# Patient Record
Sex: Female | Born: 1935
Health system: Southern US, Community
[De-identification: ages and names within clinical notes are randomized; demographics above are authoritative.]

## PROBLEM LIST (undated history)

## (undated) DIAGNOSIS — M810 Age-related osteoporosis without current pathological fracture: Secondary | ICD-10-CM

## (undated) HISTORY — PX: APPENDECTOMY: SHX54

---

## 1998-11-27 ENCOUNTER — Other Ambulatory Visit: Admission: RE | Admit: 1998-11-27 | Discharge: 1998-11-27 | Payer: Self-pay | Admitting: Gynecology

## 2000-01-01 ENCOUNTER — Encounter: Payer: Self-pay | Admitting: Gynecology

## 2000-01-01 ENCOUNTER — Encounter: Admission: RE | Admit: 2000-01-01 | Discharge: 2000-01-01 | Payer: Self-pay | Admitting: Gynecology

## 2000-01-25 ENCOUNTER — Encounter: Payer: Self-pay | Admitting: Gynecology

## 2000-01-25 ENCOUNTER — Encounter: Admission: RE | Admit: 2000-01-25 | Discharge: 2000-01-25 | Payer: Self-pay | Admitting: Gynecology

## 2000-10-25 HISTORY — PX: APPENDECTOMY: SHX54

## 2001-01-27 ENCOUNTER — Encounter: Admission: RE | Admit: 2001-01-27 | Discharge: 2001-01-27 | Payer: Self-pay | Admitting: *Deleted

## 2001-01-27 ENCOUNTER — Encounter: Payer: Self-pay | Admitting: *Deleted

## 2001-04-06 ENCOUNTER — Encounter (INDEPENDENT_AMBULATORY_CARE_PROVIDER_SITE_OTHER): Payer: Self-pay | Admitting: Specialist

## 2001-04-06 ENCOUNTER — Encounter: Payer: Self-pay | Admitting: Emergency Medicine

## 2001-04-07 ENCOUNTER — Inpatient Hospital Stay (HOSPITAL_COMMUNITY): Admission: EM | Admit: 2001-04-07 | Discharge: 2001-04-09 | Payer: Self-pay | Admitting: Emergency Medicine

## 2001-04-07 ENCOUNTER — Encounter: Payer: Self-pay | Admitting: Emergency Medicine

## 2001-05-09 ENCOUNTER — Other Ambulatory Visit: Admission: RE | Admit: 2001-05-09 | Discharge: 2001-05-09 | Payer: Self-pay | Admitting: *Deleted

## 2002-01-30 ENCOUNTER — Encounter: Admission: RE | Admit: 2002-01-30 | Discharge: 2002-01-30 | Payer: Self-pay | Admitting: *Deleted

## 2002-01-30 ENCOUNTER — Encounter: Payer: Self-pay | Admitting: *Deleted

## 2002-02-02 ENCOUNTER — Other Ambulatory Visit: Admission: RE | Admit: 2002-02-02 | Discharge: 2002-02-02 | Payer: Self-pay | Admitting: *Deleted

## 2003-02-05 ENCOUNTER — Encounter: Payer: Self-pay | Admitting: *Deleted

## 2003-02-05 ENCOUNTER — Encounter: Admission: RE | Admit: 2003-02-05 | Discharge: 2003-02-05 | Payer: Self-pay | Admitting: *Deleted

## 2003-02-07 ENCOUNTER — Other Ambulatory Visit: Admission: RE | Admit: 2003-02-07 | Discharge: 2003-02-07 | Payer: Self-pay | Admitting: *Deleted

## 2004-09-04 ENCOUNTER — Encounter: Admission: RE | Admit: 2004-09-04 | Discharge: 2004-09-04 | Payer: Self-pay | Admitting: *Deleted

## 2005-09-07 ENCOUNTER — Encounter: Admission: RE | Admit: 2005-09-07 | Discharge: 2005-09-07 | Payer: Self-pay | Admitting: *Deleted

## 2006-09-07 ENCOUNTER — Other Ambulatory Visit: Admission: RE | Admit: 2006-09-07 | Discharge: 2006-09-07 | Payer: Self-pay | Admitting: Obstetrics and Gynecology

## 2006-09-08 ENCOUNTER — Encounter: Admission: RE | Admit: 2006-09-08 | Discharge: 2006-09-08 | Payer: Self-pay | Admitting: *Deleted

## 2007-09-20 ENCOUNTER — Encounter: Admission: RE | Admit: 2007-09-20 | Discharge: 2007-09-20 | Payer: Self-pay | Admitting: Obstetrics and Gynecology

## 2008-09-13 ENCOUNTER — Other Ambulatory Visit: Admission: RE | Admit: 2008-09-13 | Discharge: 2008-09-13 | Payer: Self-pay | Admitting: Obstetrics and Gynecology

## 2008-10-10 ENCOUNTER — Encounter: Admission: RE | Admit: 2008-10-10 | Discharge: 2008-10-10 | Payer: Self-pay | Admitting: Obstetrics and Gynecology

## 2008-10-16 ENCOUNTER — Encounter: Admission: RE | Admit: 2008-10-16 | Discharge: 2008-10-16 | Payer: Self-pay | Admitting: Obstetrics and Gynecology

## 2009-04-08 ENCOUNTER — Encounter: Admission: RE | Admit: 2009-04-08 | Discharge: 2009-04-08 | Payer: Self-pay | Admitting: Obstetrics and Gynecology

## 2010-01-21 ENCOUNTER — Encounter: Admission: RE | Admit: 2010-01-21 | Discharge: 2010-01-21 | Payer: Self-pay | Admitting: Obstetrics and Gynecology

## 2011-03-12 NOTE — H&P (Signed)
[facility]  Patient:    Cynthia Benitez, Cynthia Benitez Visit Number: [id number] MRN: [id number]          Service Type: EMS Location: ED Attending Physician:  Devoria Albe Admit Date:  04/06/2001                           History and Physical  ACCOUNT:  [id number]  CHIEF COMPLAINT:  Abdominal pain.  HISTORY OF PRESENT ILLNESS:  This patient is a 76 year old lady who presented to the emergency room with, by the time I saw her, a 22-hour history of abdominal pain which began poorly localized, but was localized by the time she came to the emergency room directly in her right lower quadrant with associated nausea, cramps, and after her CT scan some diarrhea, but she also had a little of this pre-CT scan.  She felt a little bit better by the time I saw her than she had earlier in the evening.  Because of these symptoms she presented to the emergency room.  She had never had problems like this in the past and has never had any other significant GI problems.  ALLERGIES:  CODEINE and SULFA.  MEDICATIONS:  Actonel for osteoporosis.  HABITS:  Smokes none and alcohol none.  FAMILY HISTORY:  Unremarkable.  PRIOR OPERATIONS:  None.  REVIEW OF SYSTEMS:  HEENT:  Negative.  CHEST:  No cough or shortness of breath.  HEART:  Questionable history of a murmur.  No history of hypertension.  ABDOMEN:  Negative, except for HPI.  GU:  Negative. EXTREMITIES:  Negative.  PHYSICAL EXAMINATION:  GENERAL:  The patient is a healthy, but uncomfortable appearing 77 year old. She is alert and awake in no acute distress.  VITAL SIGNS:  Temperature 100.6.  HEENT:  Head is normocephalic.  Eyes nonicteric.  Pupils equal, round, and regular.  NECK:  Supple, no masses or thyromegaly.  LUNGS:  Clear to auscultation.  HEART:  Regular without murmur, rub or gallop that I can hear, although the EDP did hear a 2/6 systolic murmur.  ABDOMEN:  Generally soft, except for marked right lower  quadrant tenderness and guarding with referred rebound into the right lower quadrant and no definite mass.  No hepatosplenomegaly is noted.  RECTAL:  Not done.  EXTREMITIES:  No cyanosis or edema.  LABORATORY:  White count 16,900, hemoglobin 14.  Chemistries basically normal.  X-RAYS:  CT consistent with acute appendicitis with a retrocecal appendix. Chest x-ray looks normal to me, although official reading pending.  EKG showed mild LAE.  IMPRESSION:  Acute appendicitis.  PLAN:  For appendectomy.  She is thin and with a retrocecal appendix I think this would be best done as an open appendectomy. I discussed indications, risks, and complications with the patient and her daughter.  I think all questions have been answered and we will proceed to surgery this morning. Attending Physician:  Devoria Albe DD:  04/07/01 TD:  04/07/01 Job: [id number]

## 2011-03-12 NOTE — Discharge Summary (Signed)
[facility]  Patient:    TENECIA, IGNASIAK                       MRN: [id number] Adm. Date:  [id number] Disc. Date: [id number] Attending:  Charlton Haws                           Discharge Summary  DISCHARGE DIAGNOSIS:  Acute suppurative appendicitis.  HISTORY OF PRESENT ILLNESS:  This patient is a 77 year old lady with a fairly classic history of acute appendicitis with CT scan confirming that.  HOSPITAL COURSE:  The patient was admitted and taken to the operating room at about 2:50 a.m. where an appendectomy was performed.  She tolerated the procedure well and was begun on liquids and advanced over the next 24 hours. She was a little bit bloated on June 16, but later in the day had a good result from a Fleet enema.  Abdomen was softer and she was seen by Dr. Magnus Ivan and felt okay to be discharged.  DISCHARGE MEDICATION:  Tylox for pain.  SPECIAL INSTRUCTIONS:  She is able to shower.  FOLLOWUP:  Follow up in my office in approximately one week. DD:  04/12/01 TD:  04/13/01 Job: [id number]

## 2011-03-12 NOTE — Op Note (Signed)
[place] [facility]  Patient:    KILANI, JOFFE                       MRN: [id number] Proc. Date: 04/07/01 Adm. Date:  [id number] Attending:  Devoria Albe CC:         Al Decant. Janey Greaser, M.D.   Operative Report  PREOPERATIVE DIAGNOSIS:  Acute appendicitis.  POSTOPERATIVE DIAGNOSIS:  Acute appendicitis--gangrenous.  OPERATION PERFORMED:  Appendectomy.  SURGEON:  Currie Paris, M.D.  ANESTHESIA:  General.  INDICATIONS FOR PROCEDURE:  The patient is a 77 year old with a fairly classic history for acute appendicitis, a fairly classic physical examination and a CT scan demonstrating appendicitis.  DESCRIPTION OF PROCEDURE:  The patient was brought to the operating room and after satisfactory general endotracheal anesthesia had been obtained, the abdomen was prepped and draped.  I made a transverse right lower quadrant incision deepened it to the external oblique which was opened transversely. Deeper muscle layers were split in line with the fibers and a retractor was used to identify the peritoneum which was picked up and opened.  With gentle manipulation, I was able to palpate the appendix lying retrocecally and with a little bit more manipulation, able to manipulate it up into the wound.  It popped up nicely and had not perforated although was gangrenous almost its entire length.  The mesoappendix was clamped and divided, ligating it with 2-0 chromic.  The base of the appendix was crushed with a clamp, ligated with a 0 chromic and amputated.  2-0 silk was used to place a pursestring and the appendiceal base was inverted and the pursestring suture tied down producing a nice inversion.  The cecum was returned to the peritoneal cavity.  There was no evidence of any bleeding.  There was no significant fluid to suction out.  The peritoneum was closed with a running 0 chromic, the fascial layers with 0 PDS and the skin with a 4-0 subcuticular plus  Steri-Strips.  The patient tolerated the procedure well.  There were no operative complications.  All counts were correct. DD:  04/07/01 TD:  04/07/01 Job: [id number]

## 2011-11-08 DIAGNOSIS — I8310 Varicose veins of unspecified lower extremity with inflammation: Secondary | ICD-10-CM | POA: Diagnosis not present

## 2011-11-12 DIAGNOSIS — M79609 Pain in unspecified limb: Secondary | ICD-10-CM | POA: Diagnosis not present

## 2011-11-12 DIAGNOSIS — I8310 Varicose veins of unspecified lower extremity with inflammation: Secondary | ICD-10-CM | POA: Diagnosis not present

## 2011-12-24 DIAGNOSIS — M79609 Pain in unspecified limb: Secondary | ICD-10-CM | POA: Diagnosis not present

## 2011-12-24 DIAGNOSIS — I8310 Varicose veins of unspecified lower extremity with inflammation: Secondary | ICD-10-CM | POA: Diagnosis not present

## 2012-01-19 DIAGNOSIS — I8310 Varicose veins of unspecified lower extremity with inflammation: Secondary | ICD-10-CM | POA: Diagnosis not present

## 2012-01-19 DIAGNOSIS — M79609 Pain in unspecified limb: Secondary | ICD-10-CM | POA: Diagnosis not present

## 2012-04-03 DIAGNOSIS — L821 Other seborrheic keratosis: Secondary | ICD-10-CM | POA: Diagnosis not present

## 2012-04-03 DIAGNOSIS — D485 Neoplasm of uncertain behavior of skin: Secondary | ICD-10-CM | POA: Diagnosis not present

## 2012-04-03 DIAGNOSIS — D239 Other benign neoplasm of skin, unspecified: Secondary | ICD-10-CM | POA: Diagnosis not present

## 2012-04-03 DIAGNOSIS — Z85828 Personal history of other malignant neoplasm of skin: Secondary | ICD-10-CM | POA: Diagnosis not present

## 2012-04-03 DIAGNOSIS — L820 Inflamed seborrheic keratosis: Secondary | ICD-10-CM | POA: Diagnosis not present

## 2012-06-10 DIAGNOSIS — L42 Pityriasis rosea: Secondary | ICD-10-CM | POA: Diagnosis not present

## 2012-07-26 DIAGNOSIS — I8310 Varicose veins of unspecified lower extremity with inflammation: Secondary | ICD-10-CM | POA: Diagnosis not present

## 2012-08-03 DIAGNOSIS — I8310 Varicose veins of unspecified lower extremity with inflammation: Secondary | ICD-10-CM | POA: Diagnosis not present

## 2012-08-25 DIAGNOSIS — Z961 Presence of intraocular lens: Secondary | ICD-10-CM | POA: Diagnosis not present

## 2012-08-30 DIAGNOSIS — Z23 Encounter for immunization: Secondary | ICD-10-CM | POA: Diagnosis not present

## 2013-04-10 DIAGNOSIS — Z85828 Personal history of other malignant neoplasm of skin: Secondary | ICD-10-CM | POA: Diagnosis not present

## 2013-04-10 DIAGNOSIS — D239 Other benign neoplasm of skin, unspecified: Secondary | ICD-10-CM | POA: Diagnosis not present

## 2013-04-10 DIAGNOSIS — D485 Neoplasm of uncertain behavior of skin: Secondary | ICD-10-CM | POA: Diagnosis not present

## 2013-04-10 DIAGNOSIS — C44519 Basal cell carcinoma of skin of other part of trunk: Secondary | ICD-10-CM | POA: Diagnosis not present

## 2013-04-10 DIAGNOSIS — L821 Other seborrheic keratosis: Secondary | ICD-10-CM | POA: Diagnosis not present

## 2013-05-15 DIAGNOSIS — C44519 Basal cell carcinoma of skin of other part of trunk: Secondary | ICD-10-CM | POA: Diagnosis not present

## 2013-07-26 DIAGNOSIS — Z23 Encounter for immunization: Secondary | ICD-10-CM | POA: Diagnosis not present

## 2013-09-05 DIAGNOSIS — L570 Actinic keratosis: Secondary | ICD-10-CM | POA: Diagnosis not present

## 2013-09-05 DIAGNOSIS — Z85828 Personal history of other malignant neoplasm of skin: Secondary | ICD-10-CM | POA: Diagnosis not present

## 2013-09-05 DIAGNOSIS — L821 Other seborrheic keratosis: Secondary | ICD-10-CM | POA: Diagnosis not present

## 2013-09-05 DIAGNOSIS — D239 Other benign neoplasm of skin, unspecified: Secondary | ICD-10-CM | POA: Diagnosis not present

## 2013-09-13 DIAGNOSIS — Z961 Presence of intraocular lens: Secondary | ICD-10-CM | POA: Diagnosis not present

## 2014-05-07 DIAGNOSIS — L821 Other seborrheic keratosis: Secondary | ICD-10-CM | POA: Diagnosis not present

## 2014-05-07 DIAGNOSIS — C44711 Basal cell carcinoma of skin of unspecified lower limb, including hip: Secondary | ICD-10-CM | POA: Diagnosis not present

## 2014-05-07 DIAGNOSIS — Z85828 Personal history of other malignant neoplasm of skin: Secondary | ICD-10-CM | POA: Diagnosis not present

## 2014-05-07 DIAGNOSIS — D239 Other benign neoplasm of skin, unspecified: Secondary | ICD-10-CM | POA: Diagnosis not present

## 2014-05-07 DIAGNOSIS — D485 Neoplasm of uncertain behavior of skin: Secondary | ICD-10-CM | POA: Diagnosis not present

## 2014-06-17 DIAGNOSIS — C44711 Basal cell carcinoma of skin of unspecified lower limb, including hip: Secondary | ICD-10-CM | POA: Diagnosis not present

## 2014-08-07 DIAGNOSIS — Z23 Encounter for immunization: Secondary | ICD-10-CM | POA: Diagnosis not present

## 2014-09-26 DIAGNOSIS — Z961 Presence of intraocular lens: Secondary | ICD-10-CM | POA: Diagnosis not present

## 2015-06-05 DIAGNOSIS — Z86018 Personal history of other benign neoplasm: Secondary | ICD-10-CM | POA: Diagnosis not present

## 2015-06-05 DIAGNOSIS — L821 Other seborrheic keratosis: Secondary | ICD-10-CM | POA: Diagnosis not present

## 2015-06-05 DIAGNOSIS — Z85828 Personal history of other malignant neoplasm of skin: Secondary | ICD-10-CM | POA: Diagnosis not present

## 2015-06-05 DIAGNOSIS — D225 Melanocytic nevi of trunk: Secondary | ICD-10-CM | POA: Diagnosis not present

## 2015-06-26 DIAGNOSIS — E780 Pure hypercholesterolemia: Secondary | ICD-10-CM | POA: Diagnosis not present

## 2015-06-26 DIAGNOSIS — M810 Age-related osteoporosis without current pathological fracture: Secondary | ICD-10-CM | POA: Diagnosis not present

## 2015-06-26 DIAGNOSIS — Z23 Encounter for immunization: Secondary | ICD-10-CM | POA: Diagnosis not present

## 2015-06-26 DIAGNOSIS — D696 Thrombocytopenia, unspecified: Secondary | ICD-10-CM | POA: Diagnosis not present

## 2015-06-26 DIAGNOSIS — Z1389 Encounter for screening for other disorder: Secondary | ICD-10-CM | POA: Diagnosis not present

## 2015-06-26 DIAGNOSIS — Z131 Encounter for screening for diabetes mellitus: Secondary | ICD-10-CM | POA: Diagnosis not present

## 2015-07-31 DIAGNOSIS — D696 Thrombocytopenia, unspecified: Secondary | ICD-10-CM | POA: Diagnosis not present

## 2015-11-10 DIAGNOSIS — Z961 Presence of intraocular lens: Secondary | ICD-10-CM | POA: Diagnosis not present

## 2016-06-23 DIAGNOSIS — L821 Other seborrheic keratosis: Secondary | ICD-10-CM | POA: Diagnosis not present

## 2016-06-23 DIAGNOSIS — Z86018 Personal history of other benign neoplasm: Secondary | ICD-10-CM | POA: Diagnosis not present

## 2016-06-23 DIAGNOSIS — Z85828 Personal history of other malignant neoplasm of skin: Secondary | ICD-10-CM | POA: Diagnosis not present

## 2016-06-23 DIAGNOSIS — D225 Melanocytic nevi of trunk: Secondary | ICD-10-CM | POA: Diagnosis not present

## 2016-06-23 DIAGNOSIS — S80862A Insect bite (nonvenomous), left lower leg, initial encounter: Secondary | ICD-10-CM | POA: Diagnosis not present

## 2016-08-03 DIAGNOSIS — Z23 Encounter for immunization: Secondary | ICD-10-CM | POA: Diagnosis not present

## 2017-01-17 DIAGNOSIS — H524 Presbyopia: Secondary | ICD-10-CM | POA: Diagnosis not present

## 2017-01-17 DIAGNOSIS — Z961 Presence of intraocular lens: Secondary | ICD-10-CM | POA: Diagnosis not present

## 2017-05-12 ENCOUNTER — Encounter (HOSPITAL_BASED_OUTPATIENT_CLINIC_OR_DEPARTMENT_OTHER): Payer: Self-pay

## 2017-05-12 ENCOUNTER — Emergency Department (HOSPITAL_BASED_OUTPATIENT_CLINIC_OR_DEPARTMENT_OTHER): Payer: Medicare Other

## 2017-05-12 ENCOUNTER — Emergency Department (HOSPITAL_BASED_OUTPATIENT_CLINIC_OR_DEPARTMENT_OTHER)
Admission: EM | Admit: 2017-05-12 | Discharge: 2017-05-12 | Disposition: A | Discharge status: Home / Self Care | Payer: Medicare Other | Attending: Emergency Medicine | Admitting: Emergency Medicine

## 2017-05-12 DIAGNOSIS — Y939 Activity, unspecified: Secondary | ICD-10-CM | POA: Diagnosis not present

## 2017-05-12 DIAGNOSIS — S9031XA Contusion of right foot, initial encounter: Secondary | ICD-10-CM | POA: Insufficient documentation

## 2017-05-12 DIAGNOSIS — Y929 Unspecified place or not applicable: Secondary | ICD-10-CM | POA: Diagnosis not present

## 2017-05-12 DIAGNOSIS — Y999 Unspecified external cause status: Secondary | ICD-10-CM | POA: Diagnosis not present

## 2017-05-12 DIAGNOSIS — X58XXXA Exposure to other specified factors, initial encounter: Secondary | ICD-10-CM | POA: Diagnosis not present

## 2017-05-12 DIAGNOSIS — M7989 Other specified soft tissue disorders: Secondary | ICD-10-CM | POA: Diagnosis not present

## 2017-05-12 DIAGNOSIS — I739 Peripheral vascular disease, unspecified: Secondary | ICD-10-CM | POA: Diagnosis present

## 2017-05-12 MED ORDER — IBUPROFEN 400 MG PO TABS
400.0000 mg | ORAL_TABLET | Freq: Once | ORAL | Status: AC
  Administered 2017-05-12: 400 mg via ORAL
  Filled 2017-05-12: qty 1

## 2017-05-12 NOTE — ED Triage Notes (Addendum)
Pt states sent by Urgent Care for RLE u/s to r/o DVT - RLE pain, swelling, redness after crossing legs for one hour today. Denies shortness of breath. No recent travel. No history of same. Denies injury or insect bites.

## 2017-05-12 NOTE — ED Notes (Signed)
Pt verbalizes understanding of d/c instructions and denies any further needs at this time. 

## 2017-05-12 NOTE — ED Provider Notes (Signed)
[facility] Provider Note   CSN: [id number] Arrival date & time: 05/12/17  1958  By signing my name below, I, Collene Leyden, attest that this documentation has been prepared under the direction and in the presence of Delora Fuel, MD. Electronically Signed: Collene Leyden, Scribe. 05/12/17. 9:35 PM.  History   Chief Complaint Chief Complaint  Patient presents with  . Claudication   HPI Comments: Cynthia Benitez is a 83 y.o. female with no pertinent past medical history, who presents to the Emergency Department complaining of sudden-onset left foot cramping that began earlier today. Patient states she had been sitting for an hour with her foot in ana abnormal position, when she stood up her foot began cramping. Patient states the severity of her pain at her worst is 8/10. Patient denies any injury, trauma, or falls. Patient reports being evaluated at urgent care, who advised her to come here for a  DVT rule out. Patient reports associated bruising and swelling. No medications or modifying factors indicated. Patient is ambulatory, but states her pain is worse with ambulation. Nothing improves her pain. Patient denies any calf pain, numbness, tinging, weakness, fever, or chills.   The history is provided by the patient. No language interpreter was used.    History reviewed. No pertinent past medical history.  There are no active problems to display for this patient.   Past Surgical History:  Procedure Laterality Date  . APPENDECTOMY      OB History    No data available       Home Medications    Prior to Admission medications   Not on File    Family History History reviewed. No pertinent family history.  Social History Social History  Substance Use Topics  . Smoking status: Never Smoker  . Smokeless tobacco: Never Used  . Alcohol use No     Allergies   Codeine and Sulfa antibiotics   Review of Systems Review of Systems  Constitutional: Negative for  chills and fever.  Musculoskeletal: Positive for arthralgias (right foot) and joint swelling (right foot).  Skin: Positive for color change.  Neurological: Negative for weakness and numbness.  All other systems reviewed and are negative.    Physical Exam Updated Vital Signs BP (!) 163/88 (BP Location: Left Arm)   Pulse 87   Temp 99.8 F (37.7 C) (Oral)   Resp 18   Ht 5\' 3"  (1.6 m)   Wt 114 lb (51.7 kg)   SpO2 99%   BMI 20.19 kg/m   Physical Exam  Constitutional: She is oriented to person, place, and time. She appears well-developed and well-nourished.  HENT:  Head: Normocephalic and atraumatic.  Eyes: Pupils are equal, round, and reactive to light. EOM are normal.  Neck: Normal range of motion. Neck supple. No JVD present.  Cardiovascular: Normal rate, regular rhythm and normal heart sounds.   No murmur heard. Pulmonary/Chest: Effort normal and breath sounds normal. She has no wheezes. She has no rales. She exhibits no tenderness.  Abdominal: Soft. Bowel sounds are normal. She exhibits no distension and no mass. There is no tenderness.  Musculoskeletal: Normal range of motion. She exhibits no edema.  Ecchymosis and slight soft tissue swelling noted over the dorsolateral aspect of the right midfoot. There is moderate tenderness over this same area. Neurovascular exam is intact. There is full range of motion at the ankle and MTP joints. There is no erythema or warmth. No calf tenderness, no palpable cord.  Lymphadenopathy:  She has no cervical adenopathy.  Neurological: She is alert and oriented to person, place, and time. No cranial nerve deficit. She exhibits normal muscle tone. Coordination normal.  Skin: Skin is warm and dry. No rash noted.  Psychiatric: She has a normal mood and affect. Her behavior is normal. Judgment and thought content normal.  Nursing note and vitals reviewed.    ED Treatments / Results  DIAGNOSTIC STUDIES: Oxygen Saturation is 99% on RA, normal  by my interpretation.    COORDINATION OF CARE: 9:35 PM Discussed treatment plan with pt at bedside and pt agreed to plan, which includes imaging.   Radiology US Venous Img Lower Unilateral Right  Result Date: 05/12/2017 CLINICAL DATA:  Right leg redness, pain and swelling. EXAM: RIGHT LOWER EXTREMITY VENOUS DOPPLER ULTRASOUND TECHNIQUE: Gray-scale sonography with graded compression, as well as color Doppler and duplex ultrasound were performed to evaluate the lower extremity deep venous systems from the level of the common femoral vein and including the common femoral, femoral, profunda femoral, popliteal and calf veins including the posterior tibial, peroneal and gastrocnemius veins when visible. The superficial great saphenous vein was also interrogated. Spectral Doppler was utilized to evaluate flow at rest and with distal augmentation maneuvers in the common femoral, femoral and popliteal veins. COMPARISON:  None. FINDINGS: Contralateral Common Femoral Vein: Respiratory phasicity is normal and symmetric with the symptomatic side. No evidence of thrombus. Normal compressibility. Common Femoral Vein: No evidence of thrombus. Normal compressibility, respiratory phasicity and response to augmentation. Saphenofemoral Junction: No evidence of thrombus. Normal compressibility and flow on color Doppler imaging. Profunda Femoral Vein: No evidence of thrombus. Normal compressibility and flow on color Doppler imaging. Femoral Vein: No evidence of thrombus. Normal compressibility, respiratory phasicity and response to augmentation. Popliteal Vein: No evidence of thrombus. Normal compressibility, respiratory phasicity and response to augmentation. Calf Veins: No evidence of thrombus. Normal compressibility and flow on color Doppler imaging. Superficial Great Saphenous Vein: Status post ablation at the origin. Venous Reflux:  None. Other Findings:  None. IMPRESSION: No evidence of DVT within the right lower  extremity. Electronically Signed   By: Franki Cabot M.D.   On: 05/12/2017 21:03   Dg Foot Complete Right  Result Date: 05/12/2017 CLINICAL DATA:  Swelling of the right fourth and fifth metatarsals off the proximal legs 1 hour earlier today. EXAM: RIGHT FOOT COMPLETE - 3+ VIEW COMPARISON:  None. FINDINGS: There is no evidence of fracture or dislocation. First MTP bunion and fifth MTP bunionette with overlying soft tissue thickening and prominence noted. No acute fracture, dislocation or bone destruction. No soft tissue mineralization, erosive change or primary osseous lesions. IMPRESSION: First MTP bunion and fifth MTP bunionette with soft tissue prominence likely reactive overlying these joints. No underlying fracture, bone destruction or dislocations. Electronically Signed   By: Ashley Royalty M.D.   On: 05/12/2017 22:03    Procedures Procedures (including critical care time)  Medications Ordered in ED Medications  ibuprofen (ADVIL,MOTRIN) tablet 400 mg (not administered)     Initial Impression / Assessment and Plan / ED Course  I have reviewed the triage vital signs and the nursing notes.  Pertinent imaging results that were available during my care of the patient were reviewed by me and considered in my medical decision making (see chart for details).  Pain of the right foot. She appears to show some ecchymosis in the area of her pain. She had been sent here by primary care to get a venous ultrasound which had already  resulted by the time I saw her and showed no evidence of DVT. She has no memory of trauma, but she is sent for x-ray which shows no evidence of fracture. At this point, it seems to be a bruise to her foot. She is given a postop shoe for comfort and advised to use over-the-counter analgesics as needed. Return should pain or swelling get worse, or should new areas of spontaneous ecchymosis arise. Review her home medication so she is not on any antiplatelet agents or anticoagulants.  Review of old records shows no relevant past visits.  Final Clinical Impressions(s) / ED Diagnoses   Final diagnoses:  Contusion of right foot, initial encounter    New Prescriptions New Prescriptions   No medications on file   I personally performed the services described in this documentation, which was scribed in my presence. The recorded information has been reviewed and is accurate.       Delora Fuel, MD 16/96/78 [id number]

## 2017-05-12 NOTE — Discharge Instructions (Signed)
Wear the post-op shoe as needed. Take acetaminophen, ibuprofen, or naproxen as needed for pain. Return if you are having any problems.

## 2017-05-12 NOTE — ED Notes (Signed)
Patient transported to Ultrasound 

## 2017-06-30 DIAGNOSIS — Z85828 Personal history of other malignant neoplasm of skin: Secondary | ICD-10-CM | POA: Diagnosis not present

## 2017-06-30 DIAGNOSIS — L814 Other melanin hyperpigmentation: Secondary | ICD-10-CM | POA: Diagnosis not present

## 2017-06-30 DIAGNOSIS — D1801 Hemangioma of skin and subcutaneous tissue: Secondary | ICD-10-CM | POA: Diagnosis not present

## 2017-06-30 DIAGNOSIS — D225 Melanocytic nevi of trunk: Secondary | ICD-10-CM | POA: Diagnosis not present

## 2017-06-30 DIAGNOSIS — L821 Other seborrheic keratosis: Secondary | ICD-10-CM | POA: Diagnosis not present

## 2017-06-30 DIAGNOSIS — Z86018 Personal history of other benign neoplasm: Secondary | ICD-10-CM | POA: Diagnosis not present

## 2017-08-10 DIAGNOSIS — Z23 Encounter for immunization: Secondary | ICD-10-CM | POA: Diagnosis not present

## 2017-08-25 DIAGNOSIS — Z23 Encounter for immunization: Secondary | ICD-10-CM | POA: Diagnosis not present

## 2017-08-25 DIAGNOSIS — M25562 Pain in left knee: Secondary | ICD-10-CM | POA: Diagnosis not present

## 2017-08-29 DIAGNOSIS — M1712 Unilateral primary osteoarthritis, left knee: Secondary | ICD-10-CM | POA: Diagnosis not present

## 2017-11-02 DIAGNOSIS — D696 Thrombocytopenia, unspecified: Secondary | ICD-10-CM | POA: Diagnosis not present

## 2017-11-02 DIAGNOSIS — M810 Age-related osteoporosis without current pathological fracture: Secondary | ICD-10-CM | POA: Diagnosis not present

## 2017-11-02 DIAGNOSIS — E7800 Pure hypercholesterolemia, unspecified: Secondary | ICD-10-CM | POA: Diagnosis not present

## 2017-11-07 DIAGNOSIS — Z0000 Encounter for general adult medical examination without abnormal findings: Secondary | ICD-10-CM | POA: Diagnosis not present

## 2017-11-07 DIAGNOSIS — E7800 Pure hypercholesterolemia, unspecified: Secondary | ICD-10-CM | POA: Diagnosis not present

## 2017-11-07 DIAGNOSIS — M810 Age-related osteoporosis without current pathological fracture: Secondary | ICD-10-CM | POA: Diagnosis not present

## 2017-11-07 DIAGNOSIS — Z1211 Encounter for screening for malignant neoplasm of colon: Secondary | ICD-10-CM | POA: Diagnosis not present

## 2017-11-07 DIAGNOSIS — D696 Thrombocytopenia, unspecified: Secondary | ICD-10-CM | POA: Diagnosis not present

## 2017-11-07 DIAGNOSIS — Z1389 Encounter for screening for other disorder: Secondary | ICD-10-CM | POA: Diagnosis not present

## 2018-01-19 DIAGNOSIS — H04123 Dry eye syndrome of bilateral lacrimal glands: Secondary | ICD-10-CM | POA: Diagnosis not present

## 2018-01-19 DIAGNOSIS — Z961 Presence of intraocular lens: Secondary | ICD-10-CM | POA: Diagnosis not present

## 2018-01-19 DIAGNOSIS — H5213 Myopia, bilateral: Secondary | ICD-10-CM | POA: Diagnosis not present

## 2018-01-19 DIAGNOSIS — H524 Presbyopia: Secondary | ICD-10-CM | POA: Diagnosis not present

## 2018-01-19 DIAGNOSIS — H52203 Unspecified astigmatism, bilateral: Secondary | ICD-10-CM | POA: Diagnosis not present

## 2018-07-05 DIAGNOSIS — D485 Neoplasm of uncertain behavior of skin: Secondary | ICD-10-CM | POA: Diagnosis not present

## 2018-07-05 DIAGNOSIS — D225 Melanocytic nevi of trunk: Secondary | ICD-10-CM | POA: Diagnosis not present

## 2018-07-05 DIAGNOSIS — C44619 Basal cell carcinoma of skin of left upper limb, including shoulder: Secondary | ICD-10-CM | POA: Diagnosis not present

## 2018-07-05 DIAGNOSIS — L821 Other seborrheic keratosis: Secondary | ICD-10-CM | POA: Diagnosis not present

## 2018-07-05 DIAGNOSIS — Z86018 Personal history of other benign neoplasm: Secondary | ICD-10-CM | POA: Diagnosis not present

## 2018-07-05 DIAGNOSIS — L814 Other melanin hyperpigmentation: Secondary | ICD-10-CM | POA: Diagnosis not present

## 2018-07-05 DIAGNOSIS — D1801 Hemangioma of skin and subcutaneous tissue: Secondary | ICD-10-CM | POA: Diagnosis not present

## 2018-07-05 DIAGNOSIS — L219 Seborrheic dermatitis, unspecified: Secondary | ICD-10-CM | POA: Diagnosis not present

## 2018-07-05 DIAGNOSIS — Z85828 Personal history of other malignant neoplasm of skin: Secondary | ICD-10-CM | POA: Diagnosis not present

## 2018-07-19 DIAGNOSIS — C44619 Basal cell carcinoma of skin of left upper limb, including shoulder: Secondary | ICD-10-CM | POA: Diagnosis not present

## 2018-08-17 DIAGNOSIS — I83813 Varicose veins of bilateral lower extremities with pain: Secondary | ICD-10-CM | POA: Diagnosis not present

## 2018-08-17 DIAGNOSIS — I8311 Varicose veins of right lower extremity with inflammation: Secondary | ICD-10-CM | POA: Diagnosis not present

## 2018-08-17 DIAGNOSIS — I8312 Varicose veins of left lower extremity with inflammation: Secondary | ICD-10-CM | POA: Diagnosis not present

## 2018-08-24 DIAGNOSIS — Z23 Encounter for immunization: Secondary | ICD-10-CM | POA: Diagnosis not present

## 2018-08-31 DIAGNOSIS — I8312 Varicose veins of left lower extremity with inflammation: Secondary | ICD-10-CM | POA: Diagnosis not present

## 2018-08-31 DIAGNOSIS — I8311 Varicose veins of right lower extremity with inflammation: Secondary | ICD-10-CM | POA: Diagnosis not present

## 2018-08-31 DIAGNOSIS — I83813 Varicose veins of bilateral lower extremities with pain: Secondary | ICD-10-CM | POA: Diagnosis not present

## 2018-09-28 DIAGNOSIS — I83893 Varicose veins of bilateral lower extremities with other complications: Secondary | ICD-10-CM | POA: Diagnosis not present

## 2018-09-28 DIAGNOSIS — I8312 Varicose veins of left lower extremity with inflammation: Secondary | ICD-10-CM | POA: Diagnosis not present

## 2018-09-28 DIAGNOSIS — I83813 Varicose veins of bilateral lower extremities with pain: Secondary | ICD-10-CM | POA: Diagnosis not present

## 2018-09-28 DIAGNOSIS — I8311 Varicose veins of right lower extremity with inflammation: Secondary | ICD-10-CM | POA: Diagnosis not present

## 2018-09-29 DIAGNOSIS — I8312 Varicose veins of left lower extremity with inflammation: Secondary | ICD-10-CM | POA: Diagnosis not present

## 2018-10-13 DIAGNOSIS — I8312 Varicose veins of left lower extremity with inflammation: Secondary | ICD-10-CM | POA: Diagnosis not present

## 2018-10-13 DIAGNOSIS — M7981 Nontraumatic hematoma of soft tissue: Secondary | ICD-10-CM | POA: Diagnosis not present

## 2018-10-24 DIAGNOSIS — I83892 Varicose veins of left lower extremities with other complications: Secondary | ICD-10-CM | POA: Diagnosis not present

## 2018-10-24 DIAGNOSIS — I8312 Varicose veins of left lower extremity with inflammation: Secondary | ICD-10-CM | POA: Diagnosis not present

## 2018-11-06 DIAGNOSIS — I8312 Varicose veins of left lower extremity with inflammation: Secondary | ICD-10-CM | POA: Diagnosis not present

## 2018-11-06 DIAGNOSIS — M7981 Nontraumatic hematoma of soft tissue: Secondary | ICD-10-CM | POA: Diagnosis not present

## 2018-11-06 DIAGNOSIS — I83892 Varicose veins of left lower extremities with other complications: Secondary | ICD-10-CM | POA: Diagnosis not present

## 2018-12-01 DIAGNOSIS — M546 Pain in thoracic spine: Secondary | ICD-10-CM | POA: Diagnosis not present

## 2018-12-01 DIAGNOSIS — R079 Chest pain, unspecified: Secondary | ICD-10-CM | POA: Diagnosis not present

## 2018-12-01 DIAGNOSIS — R9431 Abnormal electrocardiogram [ECG] [EKG]: Secondary | ICD-10-CM | POA: Diagnosis not present

## 2019-01-02 DIAGNOSIS — E7800 Pure hypercholesterolemia, unspecified: Secondary | ICD-10-CM | POA: Diagnosis not present

## 2019-01-02 DIAGNOSIS — R7301 Impaired fasting glucose: Secondary | ICD-10-CM | POA: Diagnosis not present

## 2019-01-02 DIAGNOSIS — M810 Age-related osteoporosis without current pathological fracture: Secondary | ICD-10-CM | POA: Diagnosis not present

## 2019-01-02 DIAGNOSIS — D696 Thrombocytopenia, unspecified: Secondary | ICD-10-CM | POA: Diagnosis not present

## 2019-01-04 DIAGNOSIS — D696 Thrombocytopenia, unspecified: Secondary | ICD-10-CM | POA: Diagnosis not present

## 2019-01-04 DIAGNOSIS — Z0000 Encounter for general adult medical examination without abnormal findings: Secondary | ICD-10-CM | POA: Diagnosis not present

## 2019-01-04 DIAGNOSIS — Z85828 Personal history of other malignant neoplasm of skin: Secondary | ICD-10-CM | POA: Diagnosis not present

## 2019-01-04 DIAGNOSIS — R7301 Impaired fasting glucose: Secondary | ICD-10-CM | POA: Diagnosis not present

## 2019-01-04 DIAGNOSIS — I868 Varicose veins of other specified sites: Secondary | ICD-10-CM | POA: Diagnosis not present

## 2019-01-04 DIAGNOSIS — Z1389 Encounter for screening for other disorder: Secondary | ICD-10-CM | POA: Diagnosis not present

## 2019-01-08 DIAGNOSIS — I8311 Varicose veins of right lower extremity with inflammation: Secondary | ICD-10-CM | POA: Diagnosis not present

## 2019-01-08 DIAGNOSIS — I83811 Varicose veins of right lower extremities with pain: Secondary | ICD-10-CM | POA: Diagnosis not present

## 2019-01-15 DIAGNOSIS — I8311 Varicose veins of right lower extremity with inflammation: Secondary | ICD-10-CM | POA: Diagnosis not present

## 2019-01-15 DIAGNOSIS — I83811 Varicose veins of right lower extremities with pain: Secondary | ICD-10-CM | POA: Diagnosis not present

## 2019-03-22 DIAGNOSIS — I8311 Varicose veins of right lower extremity with inflammation: Secondary | ICD-10-CM | POA: Diagnosis not present

## 2019-03-22 DIAGNOSIS — I83811 Varicose veins of right lower extremities with pain: Secondary | ICD-10-CM | POA: Diagnosis not present

## 2019-04-10 DIAGNOSIS — R7303 Prediabetes: Secondary | ICD-10-CM | POA: Diagnosis not present

## 2019-05-29 DIAGNOSIS — H524 Presbyopia: Secondary | ICD-10-CM | POA: Diagnosis not present

## 2019-05-29 DIAGNOSIS — H52203 Unspecified astigmatism, bilateral: Secondary | ICD-10-CM | POA: Diagnosis not present

## 2019-05-29 DIAGNOSIS — Z961 Presence of intraocular lens: Secondary | ICD-10-CM | POA: Diagnosis not present

## 2019-05-29 DIAGNOSIS — H5213 Myopia, bilateral: Secondary | ICD-10-CM | POA: Diagnosis not present

## 2019-07-12 DIAGNOSIS — Z23 Encounter for immunization: Secondary | ICD-10-CM | POA: Diagnosis not present

## 2019-07-12 DIAGNOSIS — D225 Melanocytic nevi of trunk: Secondary | ICD-10-CM | POA: Diagnosis not present

## 2019-07-12 DIAGNOSIS — Z85828 Personal history of other malignant neoplasm of skin: Secondary | ICD-10-CM | POA: Diagnosis not present

## 2019-07-12 DIAGNOSIS — L814 Other melanin hyperpigmentation: Secondary | ICD-10-CM | POA: Diagnosis not present

## 2019-07-12 DIAGNOSIS — Z86018 Personal history of other benign neoplasm: Secondary | ICD-10-CM | POA: Diagnosis not present

## 2019-07-12 DIAGNOSIS — L821 Other seborrheic keratosis: Secondary | ICD-10-CM | POA: Diagnosis not present

## 2019-07-31 DIAGNOSIS — Z23 Encounter for immunization: Secondary | ICD-10-CM | POA: Diagnosis not present

## 2019-08-30 DIAGNOSIS — I8311 Varicose veins of right lower extremity with inflammation: Secondary | ICD-10-CM | POA: Diagnosis not present

## 2019-08-30 DIAGNOSIS — I8312 Varicose veins of left lower extremity with inflammation: Secondary | ICD-10-CM | POA: Diagnosis not present

## 2020-01-22 DIAGNOSIS — Z23 Encounter for immunization: Secondary | ICD-10-CM | POA: Diagnosis not present

## 2020-02-07 DIAGNOSIS — Z0000 Encounter for general adult medical examination without abnormal findings: Secondary | ICD-10-CM | POA: Diagnosis not present

## 2020-02-07 DIAGNOSIS — M810 Age-related osteoporosis without current pathological fracture: Secondary | ICD-10-CM | POA: Diagnosis not present

## 2020-02-07 DIAGNOSIS — Z85828 Personal history of other malignant neoplasm of skin: Secondary | ICD-10-CM | POA: Diagnosis not present

## 2020-02-07 DIAGNOSIS — E7800 Pure hypercholesterolemia, unspecified: Secondary | ICD-10-CM | POA: Diagnosis not present

## 2020-02-07 DIAGNOSIS — D696 Thrombocytopenia, unspecified: Secondary | ICD-10-CM | POA: Diagnosis not present

## 2020-02-07 DIAGNOSIS — R7303 Prediabetes: Secondary | ICD-10-CM | POA: Diagnosis not present

## 2020-02-12 DIAGNOSIS — Z23 Encounter for immunization: Secondary | ICD-10-CM | POA: Diagnosis not present

## 2020-06-23 DIAGNOSIS — H0102B Squamous blepharitis left eye, upper and lower eyelids: Secondary | ICD-10-CM | POA: Diagnosis not present

## 2020-06-23 DIAGNOSIS — H0102A Squamous blepharitis right eye, upper and lower eyelids: Secondary | ICD-10-CM | POA: Diagnosis not present

## 2020-06-23 DIAGNOSIS — Z961 Presence of intraocular lens: Secondary | ICD-10-CM | POA: Diagnosis not present

## 2020-07-16 DIAGNOSIS — L578 Other skin changes due to chronic exposure to nonionizing radiation: Secondary | ICD-10-CM | POA: Diagnosis not present

## 2020-07-16 DIAGNOSIS — Z86018 Personal history of other benign neoplasm: Secondary | ICD-10-CM | POA: Diagnosis not present

## 2020-07-16 DIAGNOSIS — Z85828 Personal history of other malignant neoplasm of skin: Secondary | ICD-10-CM | POA: Diagnosis not present

## 2020-07-16 DIAGNOSIS — D225 Melanocytic nevi of trunk: Secondary | ICD-10-CM | POA: Diagnosis not present

## 2020-07-16 DIAGNOSIS — L821 Other seborrheic keratosis: Secondary | ICD-10-CM | POA: Diagnosis not present

## 2020-07-16 DIAGNOSIS — L814 Other melanin hyperpigmentation: Secondary | ICD-10-CM | POA: Diagnosis not present

## 2020-08-12 DIAGNOSIS — Z23 Encounter for immunization: Secondary | ICD-10-CM | POA: Diagnosis not present

## 2020-10-13 DIAGNOSIS — Z23 Encounter for immunization: Secondary | ICD-10-CM | POA: Diagnosis not present

## 2021-02-10 DIAGNOSIS — D696 Thrombocytopenia, unspecified: Secondary | ICD-10-CM | POA: Diagnosis not present

## 2021-02-10 DIAGNOSIS — Z23 Encounter for immunization: Secondary | ICD-10-CM | POA: Diagnosis not present

## 2021-02-10 DIAGNOSIS — Z0000 Encounter for general adult medical examination without abnormal findings: Secondary | ICD-10-CM | POA: Diagnosis not present

## 2021-02-10 DIAGNOSIS — M810 Age-related osteoporosis without current pathological fracture: Secondary | ICD-10-CM | POA: Diagnosis not present

## 2021-02-10 DIAGNOSIS — Z1389 Encounter for screening for other disorder: Secondary | ICD-10-CM | POA: Diagnosis not present

## 2021-02-10 DIAGNOSIS — R7303 Prediabetes: Secondary | ICD-10-CM | POA: Diagnosis not present

## 2021-02-10 DIAGNOSIS — E7800 Pure hypercholesterolemia, unspecified: Secondary | ICD-10-CM | POA: Diagnosis not present

## 2021-02-14 DIAGNOSIS — F4320 Adjustment disorder, unspecified: Secondary | ICD-10-CM | POA: Diagnosis not present

## 2021-02-14 DIAGNOSIS — R030 Elevated blood-pressure reading, without diagnosis of hypertension: Secondary | ICD-10-CM | POA: Diagnosis not present

## 2021-03-22 ENCOUNTER — Emergency Department (HOSPITAL_BASED_OUTPATIENT_CLINIC_OR_DEPARTMENT_OTHER)
Admission: EM | Admit: 2021-03-22 | Discharge: 2021-03-22 | Disposition: A | Discharge status: Home / Self Care | Payer: Medicare Other | Attending: Emergency Medicine | Admitting: Emergency Medicine

## 2021-03-22 ENCOUNTER — Encounter (HOSPITAL_BASED_OUTPATIENT_CLINIC_OR_DEPARTMENT_OTHER): Payer: Self-pay

## 2021-03-22 ENCOUNTER — Emergency Department (HOSPITAL_BASED_OUTPATIENT_CLINIC_OR_DEPARTMENT_OTHER): Payer: Medicare Other

## 2021-03-22 DIAGNOSIS — M25532 Pain in left wrist: Secondary | ICD-10-CM | POA: Diagnosis not present

## 2021-03-22 DIAGNOSIS — S66912A Strain of unspecified muscle, fascia and tendon at wrist and hand level, left hand, initial encounter: Secondary | ICD-10-CM | POA: Diagnosis not present

## 2021-03-22 DIAGNOSIS — S6992XA Unspecified injury of left wrist, hand and finger(s), initial encounter: Secondary | ICD-10-CM | POA: Diagnosis present

## 2021-03-22 DIAGNOSIS — W010XXA Fall on same level from slipping, tripping and stumbling without subsequent striking against object, initial encounter: Secondary | ICD-10-CM | POA: Diagnosis not present

## 2021-03-22 HISTORY — DX: Age-related osteoporosis without current pathological fracture: M81.0

## 2021-03-22 NOTE — ED Provider Notes (Signed)
Cynthia Benitez   CSN: [id number] Arrival date & time: 03/22/21  1438     History Chief Complaint  Patient presents with  . Hand Injury    [address] onto left hand on Thursday    Cynthia Benitez is a 87 y.o. female.  Presents to ER with concern for left hand/wrist pain.  Patient reports on Thursday she fell on her outstretched left hand.  Denies any other trauma, no loss of consciousness, no head trauma.  Noted slight abrasion to the base of her palm but did not know any significant swelling, had some mild pain and this was improved with Tylenol.  Pain has since gone away but she started noticing some swelling of her left wrist and wanted this examined today.  HPI     Past Medical History:  Diagnosis Date  . Osteoporosis     There are no problems to display for this patient.   Past Surgical History:  Procedure Laterality Date  . APPENDECTOMY       OB History   No obstetric history on file.     History reviewed. No pertinent family history.  Social History   Tobacco Use  . Smoking status: Never Smoker  . Smokeless tobacco: Never Used  Substance Use Topics  . Alcohol use: No  . Drug use: No    Home Medications Prior to Admission medications   Not on File    Allergies    Codeine and Sulfa antibiotics  Review of Systems   Review of Systems  Musculoskeletal: Positive for arthralgias.  All other systems reviewed and are negative.   Physical Exam Updated Vital Signs BP (!) 178/89 (BP Location: Right Arm)   Pulse 83   Temp 99 F (37.2 C) (Oral)   Resp 16   Ht 5\' 3"  (1.6 m)   Wt 47.6 kg   SpO2 99%   BMI 18.60 kg/m   Physical Exam Vitals and nursing Benitez reviewed.  Constitutional:      General: She is not in acute distress.    Appearance: She is well-developed.  HENT:     Head: Normocephalic and atraumatic.  Eyes:     Conjunctiva/sclera: Conjunctivae normal.  Cardiovascular:     Rate and Rhythm: Normal  rate.     Pulses: Normal pulses.  Pulmonary:     Effort: Pulmonary effort is normal. No respiratory distress.  Abdominal:     Palpations: Abdomen is soft.     Tenderness: There is no abdominal tenderness.  Musculoskeletal:     Cervical back: Neck supple.     Comments: LUE: There is a superficial abrasion over the base of palm/volar wrist, mild swelling over the distal forearm/wrist, normal joint ROM, normal radial pulse, normal sensation intact distally  Skin:    General: Skin is warm and dry.  Neurological:     Mental Status: She is alert.     ED Results / Procedures / Treatments   Labs (all labs ordered are listed, but only abnormal results are displayed) Labs Reviewed - No data to display  EKG None  Radiology DG Wrist Complete Left  Result Date: 03/22/2021 CLINICAL DATA:  Left wrist pain after a fall 5 days ago. EXAM: LEFT WRIST - COMPLETE 3+ VIEW COMPARISON:  None. FINDINGS: Subtle density along the posterior aspect of the proximal carpal row on the lateral view. This may reflect soft tissue calcification or possibly a small avulsion fracture from the triquetrum. No other evidence of a fracture.  Soft tissue calcifications noted adjacent to the tip of the radial styloid and along the triangular fibrocartilage complex. Joint space narrowing with subchondral sclerosis at the scaphoid trapezium trapezoid articulation. Remaining joints normally spaced and aligned. IMPRESSION: 1. Possible subtle avulsion fracture from the dorsal aspect of the triquetrum versus soft tissue calcification. 2. No other evidence of a fracture.  No dislocation. Electronically Signed   By: Lajean Manes M.D.   On: 03/22/2021 15:53    Procedures Procedures   Medications Ordered in ED Medications - No data to display  ED Course  I have reviewed the triage vital signs and the nursing notes.  Pertinent labs & imaging results that were available during my care of the patient were reviewed by me and  considered in my medical decision making (see chart for details).    MDM Rules/Calculators/A&P                         87 year old lady presented to ER with concern for left wrist swelling after she fell on outstretched hand on Thursday.  Neurovascular intact x-ray with possible avulsion injury of the triquetrum.  Patient does not have any focal tenderness over the triquetrum bone therefore suspect this more likely is a soft tissue calcification.  Nevertheless for now in case this is a small avulsion fracture, will recommend a wrist brace, and follow-up with orthopedics this coming week.    After the discussed management above, the patient was determined to be safe for discharge.  The patient was in agreement with this plan and all questions regarding their care were answered.  ED return precautions were discussed and the patient will return to the ED with any significant worsening of condition.   Final Clinical Impression(s) / ED Diagnoses Final diagnoses:  Strain of left wrist, initial encounter    Rx / DC Orders ED Discharge Orders    None       Lucrezia Starch, MD 03/22/21 [phone]

## 2021-03-22 NOTE — ED Notes (Signed)
Patient verbalizes understanding of discharge instructions. Opportunity for questioning and answers were provided. Armband removed by staff, pt discharged from ED.  

## 2021-03-22 NOTE — Discharge Instructions (Addendum)
Recommend using brace as support.  Recommend keeping wound clean and dry, use gauze dressing as discussed.  Follow-up with orthopedics for recheck this coming week.

## 2021-03-22 NOTE — ED Triage Notes (Signed)
Thursday fell onto left hand.  Abrasion noted to left palm.  Swelling to wrist noted

## 2021-03-31 ENCOUNTER — Encounter: Payer: Self-pay | Admitting: Orthopaedic Surgery

## 2021-03-31 ENCOUNTER — Ambulatory Visit (INDEPENDENT_AMBULATORY_CARE_PROVIDER_SITE_OTHER): Payer: Medicare Other | Admitting: Orthopaedic Surgery

## 2021-03-31 DIAGNOSIS — S63502D Unspecified sprain of left wrist, subsequent encounter: Secondary | ICD-10-CM | POA: Diagnosis not present

## 2021-03-31 NOTE — Progress Notes (Signed)
The patient comes in today as referral for the emergency room after having a mechanical fall injuring her left hand and wrist.  This happened on 26 May.  After a few days she went to the [facility] emergency room on 29 May.  There was no obvious fracture but she was given follow-up with [place] since there is significant swelling.  She reports that her abrasion on the palm of her hand that is getting better overall.  She says now she is pain-free and not taking anything for pain.  She is of the swelling is down dramatically.  She otherwise denies any acute change in her medical status.  She is a very healthy and young appearing 87 year old female.  She currently denies a headache, chest pain, shortness of breath, fever, chills, nausea, vomiting.  Examination of her left hand and wrist shows full range of motion of the hand and wrist.  There is a large eschar in the palm area that I easily unroofed and found pink normal tissue underneath.  There is no pain to stressing the wrist or with range of motion of her left wrist.  Her hand is well-perfused and she moves her fingers and thumb easily and normally.  There is no numbness.  X-rays from her hand at the time of her injury showing her left hand and wrist shows no gross deformities or malalignment.  There is slight calcification in the dorsal soft tissue near the triquetrum but there is not appear to be a fracture.  Since she is doing so well follow-up can be as needed.  She can just use ointment on her palm abrasion.  All question concerns were answered and addressed.

## 2021-06-08 IMAGING — DX DG WRIST COMPLETE 3+V*L*
1 series · 4 of 4 positions shown · non-contrast
Comparison: None.

CLINICAL DATA: Left wrist pain after a fall 5 days ago.

EXAM:
LEFT WRIST - COMPLETE 3+ VIEW

[Series 1: wrist · 0.14mm/px · 4 of 4 slices shown]
[im 1/4]
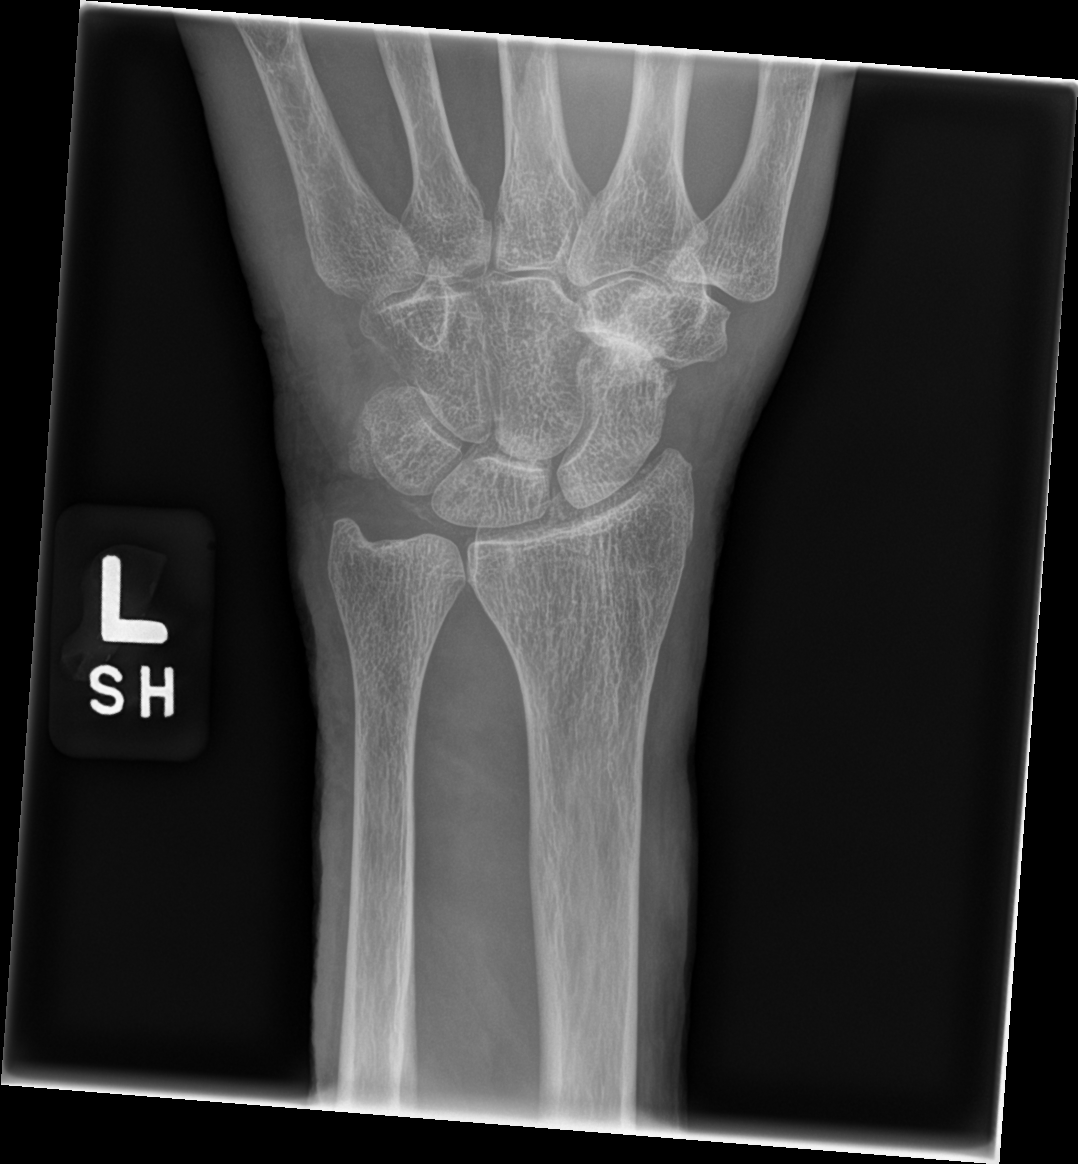
[im 2/4]
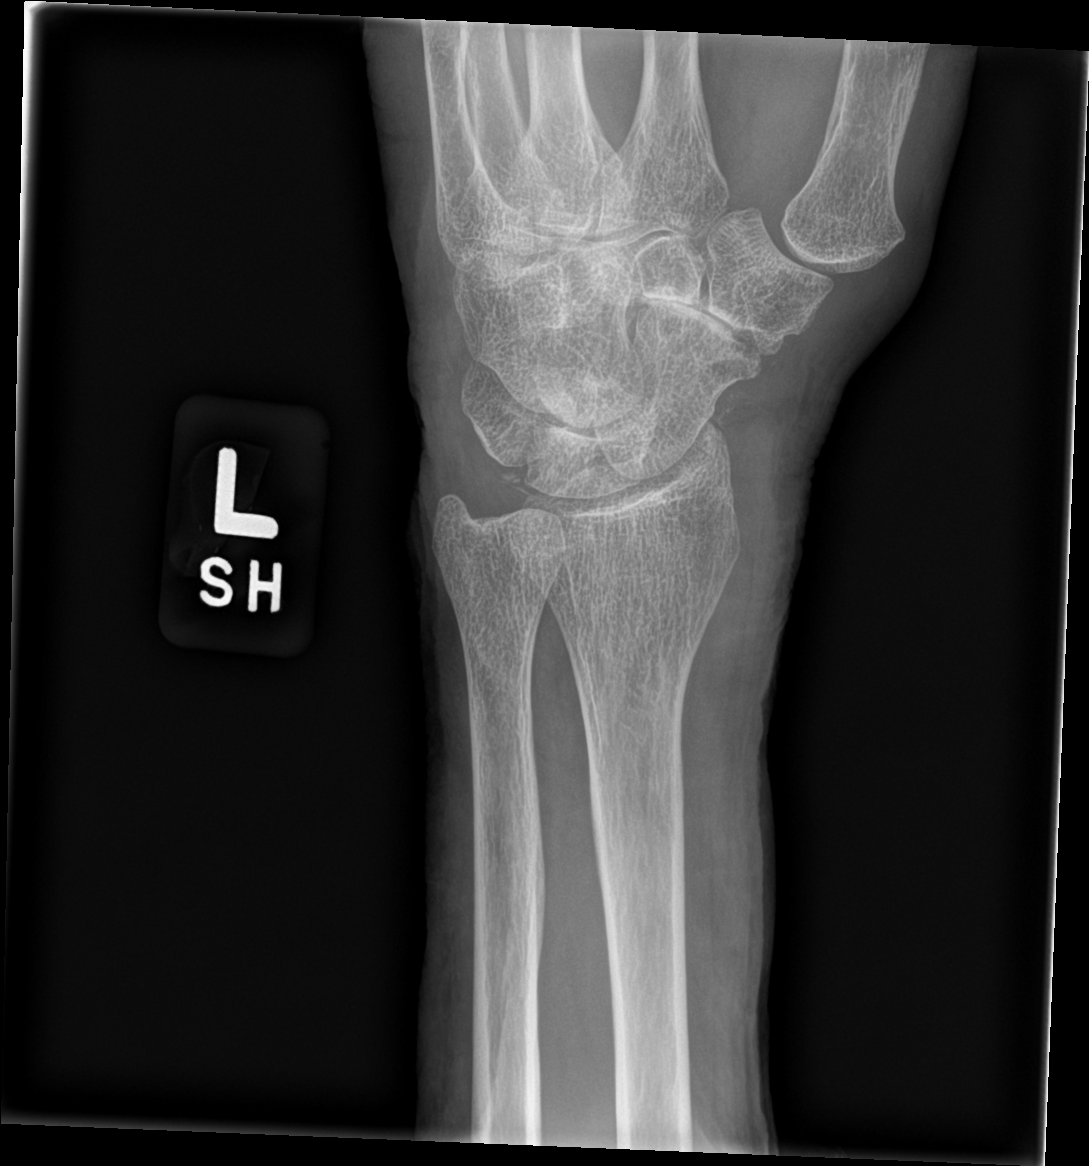
[im 3/4]
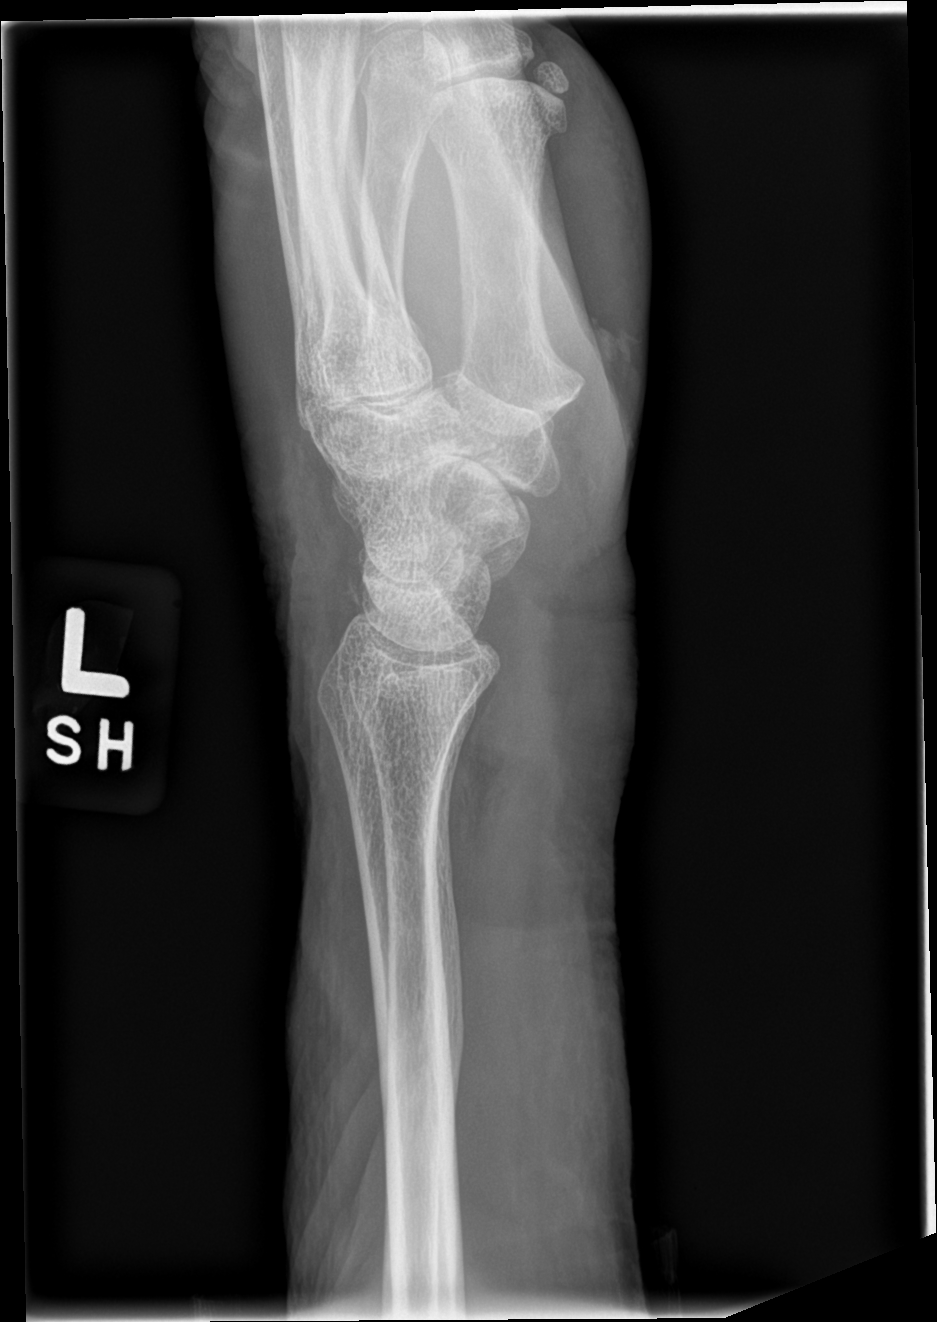
[im 4/4]
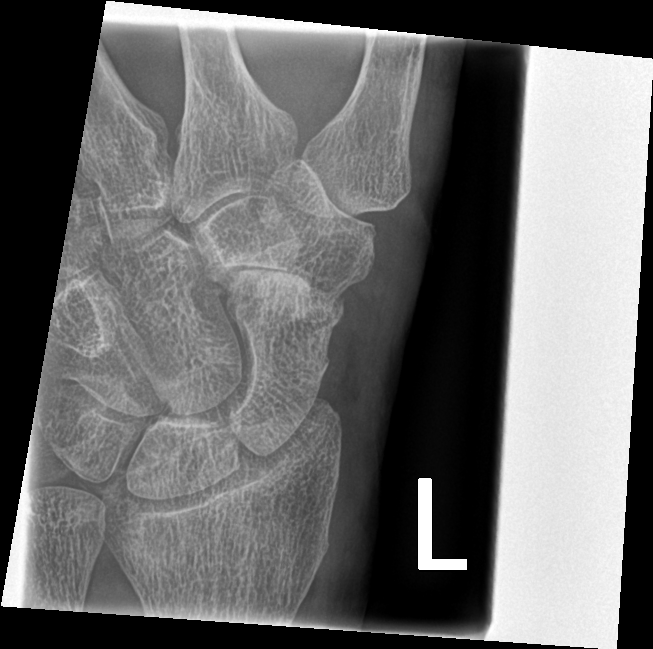

[4 of 4 positions shown; findings below may reference images not displayed]

FINDINGS: Subtle density along the posterior aspect of the proximal carpal row
on the lateral view. This may reflect soft tissue calcification or
possibly a small avulsion fracture from the triquetrum.

No other evidence of a fracture.

Soft tissue calcifications noted adjacent to the tip of the radial
styloid and along the triangular fibrocartilage complex.

Joint space narrowing with subchondral sclerosis at the scaphoid
trapezium trapezoid articulation. Remaining joints normally spaced
and aligned.
IMPRESSION: 1. Possible subtle avulsion fracture from the dorsal aspect of the
triquetrum versus soft tissue calcification.
2. No other evidence of a fracture.  No dislocation.

## 2021-07-01 DIAGNOSIS — H52212 Irregular astigmatism, left eye: Secondary | ICD-10-CM | POA: Diagnosis not present

## 2021-07-01 DIAGNOSIS — H0102B Squamous blepharitis left eye, upper and lower eyelids: Secondary | ICD-10-CM | POA: Diagnosis not present

## 2021-07-01 DIAGNOSIS — H0102A Squamous blepharitis right eye, upper and lower eyelids: Secondary | ICD-10-CM | POA: Diagnosis not present

## 2021-07-01 DIAGNOSIS — Z961 Presence of intraocular lens: Secondary | ICD-10-CM | POA: Diagnosis not present

## 2021-07-23 DIAGNOSIS — L821 Other seborrheic keratosis: Secondary | ICD-10-CM | POA: Diagnosis not present

## 2021-07-23 DIAGNOSIS — Z23 Encounter for immunization: Secondary | ICD-10-CM | POA: Diagnosis not present

## 2021-07-23 DIAGNOSIS — L814 Other melanin hyperpigmentation: Secondary | ICD-10-CM | POA: Diagnosis not present

## 2021-07-23 DIAGNOSIS — Z85828 Personal history of other malignant neoplasm of skin: Secondary | ICD-10-CM | POA: Diagnosis not present

## 2021-07-23 DIAGNOSIS — Z86018 Personal history of other benign neoplasm: Secondary | ICD-10-CM | POA: Diagnosis not present

## 2021-07-23 DIAGNOSIS — D225 Melanocytic nevi of trunk: Secondary | ICD-10-CM | POA: Diagnosis not present

## 2021-07-23 DIAGNOSIS — L578 Other skin changes due to chronic exposure to nonionizing radiation: Secondary | ICD-10-CM | POA: Diagnosis not present

## 2021-08-22 DIAGNOSIS — Z23 Encounter for immunization: Secondary | ICD-10-CM | POA: Diagnosis not present

## 2021-11-10 DIAGNOSIS — Z23 Encounter for immunization: Secondary | ICD-10-CM | POA: Diagnosis not present

## 2021-11-10 DIAGNOSIS — B351 Tinea unguium: Secondary | ICD-10-CM | POA: Diagnosis not present

## 2022-03-02 DIAGNOSIS — Z0000 Encounter for general adult medical examination without abnormal findings: Secondary | ICD-10-CM | POA: Diagnosis not present

## 2022-03-02 DIAGNOSIS — R030 Elevated blood-pressure reading, without diagnosis of hypertension: Secondary | ICD-10-CM | POA: Diagnosis not present

## 2022-03-02 DIAGNOSIS — R7303 Prediabetes: Secondary | ICD-10-CM | POA: Diagnosis not present

## 2022-03-02 DIAGNOSIS — E7800 Pure hypercholesterolemia, unspecified: Secondary | ICD-10-CM | POA: Diagnosis not present

## 2022-03-02 DIAGNOSIS — E538 Deficiency of other specified B group vitamins: Secondary | ICD-10-CM | POA: Diagnosis not present

## 2022-03-02 DIAGNOSIS — M810 Age-related osteoporosis without current pathological fracture: Secondary | ICD-10-CM | POA: Diagnosis not present

## 2022-03-02 DIAGNOSIS — Z85828 Personal history of other malignant neoplasm of skin: Secondary | ICD-10-CM | POA: Diagnosis not present

## 2022-03-23 DIAGNOSIS — E7800 Pure hypercholesterolemia, unspecified: Secondary | ICD-10-CM | POA: Diagnosis not present

## 2022-03-23 DIAGNOSIS — E538 Deficiency of other specified B group vitamins: Secondary | ICD-10-CM | POA: Diagnosis not present

## 2022-03-23 DIAGNOSIS — R030 Elevated blood-pressure reading, without diagnosis of hypertension: Secondary | ICD-10-CM | POA: Diagnosis not present

## 2022-03-23 DIAGNOSIS — R7303 Prediabetes: Secondary | ICD-10-CM | POA: Diagnosis not present

## 2022-03-23 DIAGNOSIS — M810 Age-related osteoporosis without current pathological fracture: Secondary | ICD-10-CM | POA: Diagnosis not present

## 2022-07-07 DIAGNOSIS — H18793 Other corneal deformities, bilateral: Secondary | ICD-10-CM | POA: Diagnosis not present

## 2022-07-07 DIAGNOSIS — Z961 Presence of intraocular lens: Secondary | ICD-10-CM | POA: Diagnosis not present

## 2022-07-07 DIAGNOSIS — H0102A Squamous blepharitis right eye, upper and lower eyelids: Secondary | ICD-10-CM | POA: Diagnosis not present

## 2022-07-23 DIAGNOSIS — Z23 Encounter for immunization: Secondary | ICD-10-CM | POA: Diagnosis not present

## 2022-08-04 DIAGNOSIS — L578 Other skin changes due to chronic exposure to nonionizing radiation: Secondary | ICD-10-CM | POA: Diagnosis not present

## 2022-08-04 DIAGNOSIS — Z86018 Personal history of other benign neoplasm: Secondary | ICD-10-CM | POA: Diagnosis not present

## 2022-08-04 DIAGNOSIS — Z85828 Personal history of other malignant neoplasm of skin: Secondary | ICD-10-CM | POA: Diagnosis not present

## 2022-08-04 DIAGNOSIS — L814 Other melanin hyperpigmentation: Secondary | ICD-10-CM | POA: Diagnosis not present

## 2022-08-04 DIAGNOSIS — D225 Melanocytic nevi of trunk: Secondary | ICD-10-CM | POA: Diagnosis not present

## 2022-08-04 DIAGNOSIS — L821 Other seborrheic keratosis: Secondary | ICD-10-CM | POA: Diagnosis not present

## 2023-05-23 DIAGNOSIS — Z85828 Personal history of other malignant neoplasm of skin: Secondary | ICD-10-CM | POA: Diagnosis not present

## 2023-05-23 DIAGNOSIS — E7800 Pure hypercholesterolemia, unspecified: Secondary | ICD-10-CM | POA: Diagnosis not present

## 2023-05-23 DIAGNOSIS — E538 Deficiency of other specified B group vitamins: Secondary | ICD-10-CM | POA: Diagnosis not present

## 2023-05-23 DIAGNOSIS — D696 Thrombocytopenia, unspecified: Secondary | ICD-10-CM | POA: Diagnosis not present

## 2023-05-23 DIAGNOSIS — R7303 Prediabetes: Secondary | ICD-10-CM | POA: Diagnosis not present

## 2023-05-23 DIAGNOSIS — Z23 Encounter for immunization: Secondary | ICD-10-CM | POA: Diagnosis not present

## 2023-05-23 DIAGNOSIS — M810 Age-related osteoporosis without current pathological fracture: Secondary | ICD-10-CM | POA: Diagnosis not present

## 2023-05-23 DIAGNOSIS — Z0000 Encounter for general adult medical examination without abnormal findings: Secondary | ICD-10-CM | POA: Diagnosis not present

## 2023-07-13 DIAGNOSIS — H18793 Other corneal deformities, bilateral: Secondary | ICD-10-CM | POA: Diagnosis not present

## 2023-07-13 DIAGNOSIS — Z961 Presence of intraocular lens: Secondary | ICD-10-CM | POA: Diagnosis not present

## 2023-07-13 DIAGNOSIS — H0102A Squamous blepharitis right eye, upper and lower eyelids: Secondary | ICD-10-CM | POA: Diagnosis not present

## 2023-07-13 DIAGNOSIS — H18513 Endothelial corneal dystrophy, bilateral: Secondary | ICD-10-CM | POA: Diagnosis not present

## 2023-07-13 DIAGNOSIS — H0102B Squamous blepharitis left eye, upper and lower eyelids: Secondary | ICD-10-CM | POA: Diagnosis not present

## 2023-08-15 DIAGNOSIS — Z23 Encounter for immunization: Secondary | ICD-10-CM | POA: Diagnosis not present

## 2023-08-24 DIAGNOSIS — L814 Other melanin hyperpigmentation: Secondary | ICD-10-CM | POA: Diagnosis not present

## 2023-08-24 DIAGNOSIS — L820 Inflamed seborrheic keratosis: Secondary | ICD-10-CM | POA: Diagnosis not present

## 2023-08-24 DIAGNOSIS — L570 Actinic keratosis: Secondary | ICD-10-CM | POA: Diagnosis not present

## 2023-08-24 DIAGNOSIS — L821 Other seborrheic keratosis: Secondary | ICD-10-CM | POA: Diagnosis not present

## 2023-08-24 DIAGNOSIS — L578 Other skin changes due to chronic exposure to nonionizing radiation: Secondary | ICD-10-CM | POA: Diagnosis not present

## 2023-08-24 DIAGNOSIS — L603 Nail dystrophy: Secondary | ICD-10-CM | POA: Diagnosis not present

## 2023-08-24 DIAGNOSIS — Z85828 Personal history of other malignant neoplasm of skin: Secondary | ICD-10-CM | POA: Diagnosis not present

## 2023-08-24 DIAGNOSIS — Z86018 Personal history of other benign neoplasm: Secondary | ICD-10-CM | POA: Diagnosis not present

## 2023-08-24 DIAGNOSIS — L219 Seborrheic dermatitis, unspecified: Secondary | ICD-10-CM | POA: Diagnosis not present

## 2023-08-24 DIAGNOSIS — D225 Melanocytic nevi of trunk: Secondary | ICD-10-CM | POA: Diagnosis not present

## 2024-01-23 DIAGNOSIS — E7800 Pure hypercholesterolemia, unspecified: Secondary | ICD-10-CM | POA: Diagnosis not present

## 2024-01-23 DIAGNOSIS — M810 Age-related osteoporosis without current pathological fracture: Secondary | ICD-10-CM | POA: Diagnosis not present

## 2024-02-22 DIAGNOSIS — M810 Age-related osteoporosis without current pathological fracture: Secondary | ICD-10-CM | POA: Diagnosis not present

## 2024-02-22 DIAGNOSIS — E7800 Pure hypercholesterolemia, unspecified: Secondary | ICD-10-CM | POA: Diagnosis not present

## 2024-03-24 DIAGNOSIS — M810 Age-related osteoporosis without current pathological fracture: Secondary | ICD-10-CM | POA: Diagnosis not present

## 2024-03-24 DIAGNOSIS — E7800 Pure hypercholesterolemia, unspecified: Secondary | ICD-10-CM | POA: Diagnosis not present

## 2024-06-19 DIAGNOSIS — D696 Thrombocytopenia, unspecified: Secondary | ICD-10-CM | POA: Diagnosis not present

## 2024-06-19 DIAGNOSIS — Z136 Encounter for screening for cardiovascular disorders: Secondary | ICD-10-CM | POA: Diagnosis not present

## 2024-06-19 DIAGNOSIS — Z13220 Encounter for screening for lipoid disorders: Secondary | ICD-10-CM | POA: Diagnosis not present

## 2024-06-19 DIAGNOSIS — R7303 Prediabetes: Secondary | ICD-10-CM | POA: Diagnosis not present

## 2024-06-19 DIAGNOSIS — M810 Age-related osteoporosis without current pathological fracture: Secondary | ICD-10-CM | POA: Diagnosis not present

## 2024-06-19 DIAGNOSIS — Z0000 Encounter for general adult medical examination without abnormal findings: Secondary | ICD-10-CM | POA: Diagnosis not present

## 2024-06-19 DIAGNOSIS — Z1331 Encounter for screening for depression: Secondary | ICD-10-CM | POA: Diagnosis not present

## 2024-06-19 DIAGNOSIS — R2231 Localized swelling, mass and lump, right upper limb: Secondary | ICD-10-CM | POA: Diagnosis not present

## 2024-06-21 ENCOUNTER — Other Ambulatory Visit: Payer: Self-pay | Admitting: Family Medicine

## 2024-06-21 DIAGNOSIS — R2231 Localized swelling, mass and lump, right upper limb: Secondary | ICD-10-CM

## 2024-06-29 ENCOUNTER — Ambulatory Visit
Admission: RE | Admit: 2024-06-29 | Discharge: 2024-06-29 | Disposition: A | Discharge status: Home / Self Care | Source: Ambulatory Visit | Attending: Family Medicine | Admitting: Family Medicine

## 2024-06-29 ENCOUNTER — Other Ambulatory Visit: Payer: Self-pay | Admitting: Family Medicine

## 2024-06-29 DIAGNOSIS — R928 Other abnormal and inconclusive findings on diagnostic imaging of breast: Secondary | ICD-10-CM | POA: Diagnosis not present

## 2024-06-29 DIAGNOSIS — R2231 Localized swelling, mass and lump, right upper limb: Secondary | ICD-10-CM

## 2024-06-29 DIAGNOSIS — N6315 Unspecified lump in the right breast, overlapping quadrants: Secondary | ICD-10-CM | POA: Diagnosis not present

## 2024-06-29 DIAGNOSIS — N6310 Unspecified lump in the right breast, unspecified quadrant: Secondary | ICD-10-CM

## 2024-07-06 ENCOUNTER — Ambulatory Visit
Admission: RE | Admit: 2024-07-06 | Discharge: 2024-07-06 | Disposition: A | Discharge status: Home / Self Care | Source: Ambulatory Visit | Attending: Family Medicine | Admitting: Family Medicine

## 2024-07-06 ENCOUNTER — Ambulatory Visit
Admission: RE | Admit: 2024-07-06 | Discharge: 2024-07-06 | Disposition: A | Discharge status: Home / Self Care | Source: Ambulatory Visit | Attending: Family Medicine

## 2024-07-06 DIAGNOSIS — R928 Other abnormal and inconclusive findings on diagnostic imaging of breast: Secondary | ICD-10-CM

## 2024-07-06 DIAGNOSIS — N6310 Unspecified lump in the right breast, unspecified quadrant: Secondary | ICD-10-CM

## 2024-07-06 DIAGNOSIS — D0511 Intraductal carcinoma in situ of right breast: Secondary | ICD-10-CM | POA: Diagnosis not present

## 2024-07-06 DIAGNOSIS — N6315 Unspecified lump in the right breast, overlapping quadrants: Secondary | ICD-10-CM | POA: Diagnosis not present

## 2024-07-06 HISTORY — PX: BREAST BIOPSY: SHX20

## 2024-07-09 LAB — SURGICAL PATHOLOGY

## 2024-07-11 ENCOUNTER — Other Ambulatory Visit: Payer: Self-pay | Admitting: Family Medicine

## 2024-07-11 DIAGNOSIS — R928 Other abnormal and inconclusive findings on diagnostic imaging of breast: Secondary | ICD-10-CM

## 2024-07-13 DIAGNOSIS — H0102B Squamous blepharitis left eye, upper and lower eyelids: Secondary | ICD-10-CM | POA: Diagnosis not present

## 2024-07-13 DIAGNOSIS — H18793 Other corneal deformities, bilateral: Secondary | ICD-10-CM | POA: Diagnosis not present

## 2024-07-13 DIAGNOSIS — H18513 Endothelial corneal dystrophy, bilateral: Secondary | ICD-10-CM | POA: Diagnosis not present

## 2024-07-13 DIAGNOSIS — Z961 Presence of intraocular lens: Secondary | ICD-10-CM | POA: Diagnosis not present

## 2024-07-16 ENCOUNTER — Other Ambulatory Visit: Payer: Self-pay | Admitting: Family Medicine

## 2024-07-16 ENCOUNTER — Ambulatory Visit
Admission: RE | Admit: 2024-07-16 | Discharge: 2024-07-16 | Disposition: A | Discharge status: Home / Self Care | Source: Ambulatory Visit | Attending: Family Medicine | Admitting: Family Medicine

## 2024-07-16 ENCOUNTER — Ambulatory Visit
Admission: RE | Admit: 2024-07-16 | Discharge: 2024-07-16 | Disposition: A | Discharge status: Home / Self Care | Source: Ambulatory Visit | Attending: Family Medicine

## 2024-07-16 DIAGNOSIS — R921 Mammographic calcification found on diagnostic imaging of breast: Secondary | ICD-10-CM | POA: Diagnosis not present

## 2024-07-16 DIAGNOSIS — R928 Other abnormal and inconclusive findings on diagnostic imaging of breast: Secondary | ICD-10-CM

## 2024-07-16 DIAGNOSIS — N6315 Unspecified lump in the right breast, overlapping quadrants: Secondary | ICD-10-CM | POA: Diagnosis not present

## 2024-07-16 DIAGNOSIS — D0511 Intraductal carcinoma in situ of right breast: Secondary | ICD-10-CM | POA: Diagnosis not present

## 2024-07-16 HISTORY — PX: BREAST BIOPSY: SHX20

## 2024-07-17 LAB — SURGICAL PATHOLOGY

## 2024-07-18 ENCOUNTER — Ambulatory Visit: Payer: Self-pay | Admitting: General Surgery

## 2024-07-18 DIAGNOSIS — D0511 Intraductal carcinoma in situ of right breast: Secondary | ICD-10-CM | POA: Diagnosis not present

## 2024-07-19 ENCOUNTER — Telehealth: Payer: Self-pay | Admitting: Radiation Oncology

## 2024-07-19 NOTE — Telephone Encounter (Signed)
 9/25 @ 9:50 am Spoke to patient, she stated daughter schedules her appointments.  Daughter will be available after 1:00 pm on today.

## 2024-07-20 ENCOUNTER — Other Ambulatory Visit: Payer: Self-pay | Admitting: General Surgery

## 2024-07-20 DIAGNOSIS — D0511 Intraductal carcinoma in situ of right breast: Secondary | ICD-10-CM

## 2024-07-20 NOTE — Progress Notes (Addendum)
 Nursing note documented by Eudora Claudene GAILS, LPN  Location of Breast Cancer:Ductal carcinoma in situ (DCIS) of right breast   Histology per Pathology Report:   R Breast Biopsy, calcifications of central outer R breast, 07/16/24:   Accession #: [id number] Patient Name: ADESUWA, OSGOOD Visit # : [id number]  MRN: [id number] Physician: Alphia Mom DOB/Age [birth year] (Age: 89) Gender: F Collected Date: 07/16/2024 Received Date: 07/16/2024  FINAL DIAGNOSIS       1. Breast, right, needle core biopsy, calcifications, central outer, x clip :      DUCTAL CARCINOMA IN SITU, HIGH GRADE, SOLID TYPE, WITH APOCRINE FEATURES      NECROSIS: NOT PRESENT      CALCIFICATIONS: PRESENT      DCIS LENGTH: MULTIFOCAL WITH 1 MM IN LARGEST LINEAR DIMENSION       Estrogen Receptor:  95%, POSITIVE, STRONG STAINING INTENSITY  Progesterone Receptor:  5%, POSITIVE, STRONG STAINING INTENSITY        R Breast Biopsy, mass of upper outer quadrant R breast, 07/06/24:   Accession #: [id number]  Patient Name: TAYRA, DAWE  Visit # : [id number]   MRN: [id number]  Physician: Avanell Lenis  DOB/Age 01-Aug-1931 (Age: 89) Gender: F  Collected Date: 07/06/2024  Received Date: 07/06/2024   FINAL DIAGNOSIS        1. Breast, right, needle core biopsy, 9 o'clock, 4cmfn, ribbon clip :       DUCTAL CARCINOMA IN SITU, CRIBRIFORM, HIGH NUCLEAR GRADE       MICROSCOPIC FOCUS SUSPICIOUS FOR INVASION       NECROSIS: NOT IDENTIFIED       CALCIFICATIONS: NOT IDENTIFIED       DCIS LENGTH: 0.3 CM    Estrogen Receptor:  95%, POSITIVE, STRONG STAINING INTENSITY  Progesterone Receptor:  30%, POSITIVE, WEAK STAINING INTENSITY    Did patient present with symptoms (if so, please note symptoms) or was this found on screening mammography?: The patient is a 89 year old female who recently noticed some distortion near her axilla. She brought this to her medical doctor's attention and was evaluated with mammogram and ultrasound.    Past/Anticipated interventions by surgeon, if any:    Lymphedema issues, if any:  No   Pain issues, if any:  No   SAFETY ISSUES: Prior radiation? No Pacemaker/ICD? No Possible current pregnancy?No Is the patient on methotrexate? No  Current Complaints / other details:      No vitals were taken for this visit.

## 2024-07-22 DIAGNOSIS — D0511 Intraductal carcinoma in situ of right breast: Secondary | ICD-10-CM | POA: Insufficient documentation

## 2024-07-22 NOTE — Progress Notes (Signed)
 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 female seen in consultation for radiation therapy.  Ms. Goble presented after noticing distortion in the upper outer quadrant/axilla of her R breast. She subsequently went for diagnostic mammogram and US  which revealed an area of architectural distortion at 9 o'clock, 4 cm from the nipple with an 8 mm sonographic correlate as well as 14 mm of calcifications anteriorly.   Biopsy of the focal mass was recommended with assessment of calcifications pending pathology of initial biopsy. She underwent US  guided biopsy on 07/06/24 which revealed high grade, cribiform DCIS, ER 95%, PR 30%, with a microscopic focus suspicious for invasion at 9 o'clock 4 cm from the nipple. Subsequent US  guided biopsy of calcifications was concordant, demonstrating high grade DCIS, ER 95%, PR 5%.  She was referred to Dr. Curvin in general surgery who she saw in consult on 07/18/24. He is planning breast conservation without nodal assessment.   Estrogen exposure history:  Childbearing/breastfeeding:   Family history of cancer:  PREVIOUS RADIATION THERAPY: {EXAM; YES/NO:19492::No}  AUTOIMMUNE DISEASE: {EXAM; YES/NO:19492::No}  MEDICAL DEVICES: {EXAM; YES/NO:19492::No}  PREGNANCY: {EXAM; YES/NO:19492::No}   PAST MEDICAL HISTORY:  Past Medical History:  Diagnosis Date   Osteoporosis       PAST SURGICAL HISTORY: Past Surgical History:  Procedure Laterality Date   APPENDECTOMY     BREAST BIOPSY Right 07/06/2024   US  RT BREAST BX W LOC DEV 1ST LESION IMG BX SPEC US  GUIDE 07/06/2024 [facility]   BREAST BIOPSY Right 07/16/2024    MM RT BREAST BX W LOC DEV 1ST LESION IMAGE BX SPEC STEREO GUIDE 07/16/2024 [facility]   BREAST BIOPSY Right 07/16/2024   US  RT PLC BREAST LOC DEV   1ST LESION  INC US  GUIDE 07/16/2024 [facility]     FAMILY HISTORY: No family history on file.   SOCIAL HISTORY:  reports that she has never smoked. She has never used smokeless tobacco. She reports that she does not drink alcohol and does not use drugs.   ALLERGIES: Codeine and Sulfa antibiotics   MEDICATIONS:  No current outpatient medications on file.   No current facility-administered medications for this encounter.     REVIEW OF SYSTEMS: The patient reports that s***/he is doing well overall and a review of symptoms is otherwise negative.      PHYSICAL EXAM:  Wt Readings from Last 3 Encounters:  03/22/21 105 lb (47.6 kg)  05/12/17 114 lb (51.7 kg)   Temp Readings from Last 3 Encounters:  03/22/21 99 F (37.2 C) (Oral)  05/12/17 99.8 F (37.7 C) (Oral)   BP Readings from Last 3 Encounters:  03/22/21 (!) 162/78  05/12/17 (!) 163/88   Pulse Readings from Last 3 Encounters:  03/22/21 77  05/12/17 87    /10   Physical Exam   Breast Exam:  Well-healed lumpectomy/re-excision scar in the *** quadrant. No erythema, induration, or drainage. No palpable masses or nodularity at the surgical site. No skin changes, nipple inversion, or discharge.   ECOG = ***   LABORATORY DATA:  No results found for: WBC, HGB, HCT,  MCV, PLT No results found for: NA, K, CL, CO2 No results found for: ALT, AST, GGT, ALKPHOS, BILITOT    RADIOGRAPHY: MM RT BREAST BX W LOC DEV 1ST LESION IMAGE BX SPEC STEREO GUIDE Addendum Date: 07/17/2024 ADDENDUM REPORT: 07/17/2024 15:39 ADDENDUM: PATHOLOGY revealed: 1. Breast, right, needle core biopsy, calcifications, central outer, x clip - DUCTAL CARCINOMA IN SITU, HIGH GRADE, SOLID TYPE, WITH APOCRINE FEATURES - NECROSIS: NOT PRESENT - CALCIFICATIONS:  PRESENT - DCIS LENGTH: MULTIFOCAL WITH 1 MM IN LARGEST LINEAR DIMENSION. Pathology results are CONCORDANT with imaging findings, per Dr. Inocente Ast. Pathology results and recommendations were discussed with patient via telephone on 07/17/2024 by Jacquline Cooter RN. Patient reported biopsy site doing well with no adverse symptoms, and slight tenderness at the site. Post biopsy care instructions were reviewed, questions were answered and my direct phone number was provided. Patient was instructed to call [facility] for any additional questions or concerns related to biopsy site. RECOMMENDATION: Proceed with surgical management. Patient has appointment with surgeon on 07/18/2024 at [facility]. Pathology results reported by Jacquline Cooter RN on 07/17/2024. Electronically Signed   By: Inocente Ast M.D.   On: 07/17/2024 15:39   Result Date: 07/17/2024 CLINICAL DATA:  89 year old female presenting for biopsy of calcifications in the right breast patient has newly diagnosed right breast DCIS in a separate area. EXAM: RIGHT BREAST STEREOTACTIC CORE NEEDLE BIOPSY COMPARISON:  Previous exam(s). FINDINGS: The patient and I discussed the procedure of stereotactic-guided biopsy including benefits and alternatives. We discussed the high likelihood of a successful procedure. We discussed the risks of the procedure including infection, bleeding, tissue injury, clip migration, and inadequate sampling. Informed written consent was given. The usual time out protocol was performed immediately prior to the procedure. Using sterile technique and 1% Lidocaine as local anesthetic, under stereotactic guidance, a 9 gauge vacuum assisted device was used to perform core needle biopsy of calcifications in the central outer right breast using a lateral approach. Specimen radiograph was performed showing at least 2 specimens with calcifications. Specimens with calcifications are identified for  pathology. Lesion quadrant: Lower outer quadrant At the conclusion of the procedure, an X shaped tissue marker clip was deployed into the biopsy cavity. Follow-up 2-view mammogram was performed and dictated separately. IMPRESSION: Stereotactic-guided biopsy of calcifications in the central outer right breast. No apparent complications. Electronically Signed: By: Inocente Ast M.D. On: 07/16/2024 15:05   US  RT PLC BREAST LOC DEV   1ST LESION  INC US  GUIDE Result Date: 07/16/2024 CLINICAL DATA:  89 year old female presenting for biopsy clip localization of the right breast. Patient had a recent biopsy of the right breast at 9 o'clock demonstrating DCIS and the clip did not deploy. EXAM: NEEDLE LOCALIZATION OF THE RIGHT BREAST WITH ULTRASOUND GUIDANCE COMPARISON:  Previous exam(s). FINDINGS: I met with the patient and we discussed the procedure of needle localization including benefits and alternatives. We discussed the high likelihood of a successful procedure. We discussed the risks of the procedure, including infection, bleeding, tissue injury, and further surgery. Informed, written consent was given. The usual time-out protocol was performed immediately prior to the procedure. Using ultrasound guidance, sterile technique, 1% lidocaine, a coil clip was deployed at the site of the biopsied mass in the right breast 9 o'clock. Due to concern that the coil clip did not deploy a HydroMARK was then deployed and seen within the mass under ultrasound guidance. Postprocedure mammogram was performed to confirm clip placement. IMPRESSION: Ultrasound  guided biopsy clip placement of the right breast. No apparent complications. Electronically Signed   By: Inocente Ast M.D.   On: 07/16/2024 15:11   MM CLIP PLACEMENT RIGHT Result Date: 07/16/2024 CLINICAL DATA:  Post procedure mammogram for clip placement EXAM: 3D DIAGNOSTIC RIGHT MAMMOGRAM POST STEREOTACTIC BIOPSY COMPARISON:  Previous exam(s). ACR Breast Density  Category c: The breasts are heterogeneously dense, which may obscure small masses. FINDINGS: 3D Mammographic images were obtained following stereotactic guided biopsy of calcifications in the central outer right breast. The X biopsy marking clip appears slightly displaced medially by approximately 1 cm from the center of the calcifications. Additionally the coil and HydroMARK biopsy clips which were placed at the site of recent prior biopsy (the ribbon biopsy marking clip did not deploy) appear to be along the anterolateral margin of the center of the targeted distortion. IMPRESSION: 1. The X biopsy marking clip appears slightly displaced medially by approximately 1 cm from the center of the calcifications. 2. The coil and HydroMARK clips placed at the site of recently biopsied DCIS appear at the anterolateral margin of the targeted distortion. The clips are approximately 1.1 cm from the center of the distortion. Final Assessment: Post Procedure Mammograms for Marker Placement Electronically Signed   By: Inocente Ast M.D.   On: 07/16/2024 15:09   US  RT BREAST BX W LOC DEV 1ST LESION IMG BX SPEC US  GUIDE Addendum Date: 07/11/2024 ADDENDUM REPORT: 07/11/2024 07:24 ADDENDUM: Pathology revealed DUCTAL CARCINOMA IN SITU, CRIBRIFORM, HIGH NUCLEAR GRADE, MICROSCOPIC FOCUS SUSPICIOUS FOR INVASION of the RIGHT breast, 9 o'clock, 4cmfn, (ribbon clip). This was found to be concordant by Dr. Alm Parkins. Pathology results were discussed with the patient and her daughter, Andres Allen by telephone, per request. The patient reported doing well after the biopsy with minimal tenderness at the site. Post biopsy instructions and care were reviewed and questions were answered. The patient was encouraged to call [facility] for any additional concerns. My direct phone number was provided. The patient is being scheduled for a RIGHT breast stereotactic guided biopsy. Further recommendations will be  guided by the results of this biopsy. Secure email was sent to [facility] scheduling department on July 09, 2024 at 9:46 AM requesting the patient be called with a biopsy appointment. Surgical consultation request was sent to [facility] via secure EPIC message on July 09, 2024. Pathology results reported by Hendricks Benders, RN on 07/09/2024. Electronically Signed   By: Alm Parkins M.D.   On: 07/11/2024 07:24   Result Date: 07/11/2024 CLINICAL DATA:  Patient presents for ultrasound-guided core needle biopsy a right breast mass. EXAM: ULTRASOUND GUIDED RIGHT BREAST CORE NEEDLE BIOPSY COMPARISON:  Previous exam(s). PROCEDURE: I met with the patient and we discussed the procedure of ultrasound-guided biopsy, including benefits and alternatives. We discussed the high likelihood of a successful procedure. We discussed the risks of the procedure, including infection, bleeding, tissue injury, clip migration, and inadequate sampling. Informed written consent was given. The usual time-out protocol was performed immediately prior to the procedure. Lesion quadrant: Upper outer quadrant: Near 9 o'clock. Using sterile technique and 1% Lidocaine as local anesthetic, under direct ultrasound visualization, a 14 gauge spring-loaded device was used to perform biopsy of the ill-defined, 8 mm mass in the posterior breast at 9 o'clock, 4 cm the nipple, using an inferior approach. At the conclusion of the procedure a ribbon shaped tissue marker clip was deployed into the biopsy cavity. Follow up 2 view mammogram  was performed and dictated separately. IMPRESSION: Ultrasound guided biopsy of a right breast mass. No apparent complications. Electronically Signed: By: Alm Parkins M.D. On: 07/06/2024 09:20   MM CLIP PLACEMENT RIGHT Result Date: 07/06/2024 CLINICAL DATA:  Assess post biopsy marker clip placement following ultrasound-guided core needle biopsy of a right breast lesion. EXAM: 3D DIAGNOSTIC RIGHT MAMMOGRAM POST  ULTRASOUND BIOPSY COMPARISON:  Previous exam(s). ACR Breast Density Category c: The breasts are heterogeneously dense, which may obscure small masses. FINDINGS: 3D Mammographic images were obtained following ultrasound guided biopsy of a right breast lesion felt to correspond to a distortion noted mammographically. The post biopsy marker clip, which was a ribbon shaped post biopsy marker clip, is not visualized on the mammogram. This is most likely due to the far posterior placement, with the ultrasound lesion being just superficial to the pectoralis major muscle in the 9 o'clock position. Possible, but less likely, the marker clip did not deploy a. There is post biopsy air that tracks to the level of distortion on both the CC and MLO views supporting that the area of this distortion was sampled. IMPRESSION: Post biopsy marker clip not visualized as detailed above. If biopsy results are positive for carcinoma, recommend stereotactic core needle biopsy of the calcifications at which time a marker clip can be placed within the area of distortion. If results are benign, then six-month follow-up diagnostic mammography with possible ultrasound would be recommended for the residual calcifications and to reassess the area of distortion. Final Assessment: Post Procedure Mammograms for Marker Placement Electronically Signed   By: Alm Parkins M.D.   On: 07/06/2024 09:51   MM 3D DIAGNOSTIC MAMMOGRAM BILATERAL BREAST Result Date: 06/29/2024 CLINICAL DATA:  RIGHT axillary palpable EXAM: DIGITAL DIAGNOSTIC BILATERAL MAMMOGRAM WITH TOMOSYNTHESIS AND CAD; ULTRASOUND RIGHT BREAST LIMITED TECHNIQUE: Bilateral digital diagnostic mammography and breast tomosynthesis was performed. The images were evaluated with computer-aided detection. ; Targeted ultrasound examination of the right breast was performed COMPARISON:  None available. ACR Breast Density Category c: The breasts are heterogeneously dense, which may obscure small masses.  FINDINGS: Diagnostic images of the RIGHT breast demonstrate an area of architectural distortion in the RIGHT outer breast at middle to posterior depth. It is best seen on CC slice 49, MLO slice 39, spot MLO volume 2 slice 39 and ML slice 33. Spot magnification views of the RIGHT breast demonstrate a group of coarse and linear calcifications anterior to this area of architectural distortion. These span approximately 14 mm. There are smooth rod type calcifications noted in the vicinity as well. Spot compression tomosynthesis views were obtained of the site of concern in the RIGHT axilla. No suspicious mammographic findings are noted in this area. No suspicious mass, distortion, or microcalcifications are identified to suggest presence of malignancy in the LEFT breast. On physical exam, no suspicious mass is appreciated in the site of palpable concern in the RIGHT axilla. Targeted ultrasound was performed the RIGHT outer breast. At 9 o'clock 4 cm from the nipple, there is an irregular hypoechoic mass with indistinct margins. It measures 8 x 7 x 5 mm. This is favored to correspond to the site of architectural distortion concern. Targeted ultrasound was performed of the site of palpable concern in the RIGHT axilla. No suspicious cystic or solid mass is seen at the site of palpable concern. Benign appearing RIGHT axillary lymph nodes are noted with thin smooth cortices and preserved echogenic hila. IMPRESSION: 1. There is an indeterminate area of architectural distortion with a favored  8 mm sonographic correlate. Recommend ultrasound-guided biopsy for definitive characterization. Recommend attention on post marker placement mammogram to assess for mammographic/sonographic correlation. 2. There are approximately 14 mm of calcifications anterior to the site of architectural distortion of mixed morphology. These may reflect early rod type calcifications and benign dystrophic calcifications. Recommend management of these  calcifications be determined based off of the biopsy results. With malignant results, stereotactic guided biopsy would be recommended of these calcifications. With benign results, stereotactic guided biopsy versus follow-up diagnostic mammogram in 6 months as per patient preference would be recommended. 3. No mammographic or sonographic evidence of malignancy at the site of palpable concern in the RIGHT axilla. Benign axillary lymph nodes are noted. Any further workup of the patient's symptoms should be based on the clinical assessment. 4. No mammographic evidence of malignancy in the LEFT breast. RECOMMENDATION: 1. RIGHT breast ultrasound-guided biopsy x1 2. With malignant results, recommend RIGHT breast stereotactic guided biopsy of the calcifications anterior to the area of architectural distortion for definitive characterization. If there are benign results, calcifications could be followed with diagnostic mammogram in 6 months versus sampled with stereotactic guided biopsy. I have discussed the findings and recommendations with the patient. The biopsy procedure was discussed with the patient and questions were answered. Patient expressed their understanding of the biopsy recommendation. Patient will be scheduled for biopsy at her earliest convenience by the schedulers. Ordering provider will be notified. If applicable, a reminder letter will be sent to the patient regarding the next appointment. BI-RADS CATEGORY  4: Suspicious. Electronically Signed   By: Corean Salter M.D.   On: 06/29/2024 12:05   US  LIMITED ULTRASOUND INCLUDING AXILLA RIGHT BREAST Result Date: 06/29/2024 CLINICAL DATA:  RIGHT axillary palpable EXAM: DIGITAL DIAGNOSTIC BILATERAL MAMMOGRAM WITH TOMOSYNTHESIS AND CAD; ULTRASOUND RIGHT BREAST LIMITED TECHNIQUE: Bilateral digital diagnostic mammography and breast tomosynthesis was performed. The images were evaluated with computer-aided detection. ; Targeted ultrasound examination of the  right breast was performed COMPARISON:  None available. ACR Breast Density Category c: The breasts are heterogeneously dense, which may obscure small masses. FINDINGS: Diagnostic images of the RIGHT breast demonstrate an area of architectural distortion in the RIGHT outer breast at middle to posterior depth. It is best seen on CC slice 49, MLO slice 39, spot MLO volume 2 slice 39 and ML slice 33. Spot magnification views of the RIGHT breast demonstrate a group of coarse and linear calcifications anterior to this area of architectural distortion. These span approximately 14 mm. There are smooth rod type calcifications noted in the vicinity as well. Spot compression tomosynthesis views were obtained of the site of concern in the RIGHT axilla. No suspicious mammographic findings are noted in this area. No suspicious mass, distortion, or microcalcifications are identified to suggest presence of malignancy in the LEFT breast. On physical exam, no suspicious mass is appreciated in the site of palpable concern in the RIGHT axilla. Targeted ultrasound was performed the RIGHT outer breast. At 9 o'clock 4 cm from the nipple, there is an irregular hypoechoic mass with indistinct margins. It measures 8 x 7 x 5 mm. This is favored to correspond to the site of architectural distortion concern. Targeted ultrasound was performed of the site of palpable concern in the RIGHT axilla. No suspicious cystic or solid mass is seen at the site of palpable concern. Benign appearing RIGHT axillary lymph nodes are noted with thin smooth cortices and preserved echogenic hila. IMPRESSION: 1. There is an indeterminate area of architectural distortion with a favored  8 mm sonographic correlate. Recommend ultrasound-guided biopsy for definitive characterization. Recommend attention on post marker placement mammogram to assess for mammographic/sonographic correlation. 2. There are approximately 14 mm of calcifications anterior to the site of  architectural distortion of mixed morphology. These may reflect early rod type calcifications and benign dystrophic calcifications. Recommend management of these calcifications be determined based off of the biopsy results. With malignant results, stereotactic guided biopsy would be recommended of these calcifications. With benign results, stereotactic guided biopsy versus follow-up diagnostic mammogram in 6 months as per patient preference would be recommended. 3. No mammographic or sonographic evidence of malignancy at the site of palpable concern in the RIGHT axilla. Benign axillary lymph nodes are noted. Any further workup of the patient's symptoms should be based on the clinical assessment. 4. No mammographic evidence of malignancy in the LEFT breast. RECOMMENDATION: 1. RIGHT breast ultrasound-guided biopsy x1 2. With malignant results, recommend RIGHT breast stereotactic guided biopsy of the calcifications anterior to the area of architectural distortion for definitive characterization. If there are benign results, calcifications could be followed with diagnostic mammogram in 6 months versus sampled with stereotactic guided biopsy. I have discussed the findings and recommendations with the patient. The biopsy procedure was discussed with the patient and questions were answered. Patient expressed their understanding of the biopsy recommendation. Patient will be scheduled for biopsy at her earliest convenience by the schedulers. Ordering provider will be notified. If applicable, a reminder letter will be sent to the patient regarding the next appointment. BI-RADS CATEGORY  4: Suspicious. Electronically Signed   By: Corean Salter M.D.   On: 06/29/2024 12:05     PATHOLOGY:  R Breast Biopsy, calcifications of central outer R breast, 07/16/24:  Accession #: [id number] Patient Name: JELISHA, WEED Visit # : [id number]  MRN: [id number] Physician: Alphia Mom DOB/Age [birth year] (Age: 31) Gender:  F Collected Date: 07/16/2024 Received Date: 07/16/2024  FINAL DIAGNOSIS       1. Breast, right, needle core biopsy, calcifications, central outer, x clip :      DUCTAL CARCINOMA IN SITU, HIGH GRADE, SOLID TYPE, WITH APOCRINE FEATURES      NECROSIS: NOT PRESENT      CALCIFICATIONS: PRESENT      DCIS LENGTH: MULTIFOCAL WITH 1 MM IN LARGEST LINEAR DIMENSION       Estrogen Receptor:  95%, POSITIVE, STRONG STAINING INTENSITY  Progesterone Receptor:  5%, POSITIVE, STRONG STAINING INTENSITY     R Breast Biopsy, mass of upper outer quadrant R breast, 07/06/24:  Accession #: [id number]  Patient Name: GWENYTH, DINGEE  Visit # : [id number]   MRN: [id number]  Physician: Avanell Lenis  DOB/Age [birth year] (Age: 66) Gender: F  Collected Date: 07/06/2024  Received Date: 07/06/2024   FINAL DIAGNOSIS        1. Breast, right, needle core biopsy, 9 o'clock, 4cmfn, ribbon clip :       DUCTAL CARCINOMA IN SITU, CRIBRIFORM, HIGH NUCLEAR GRADE       MICROSCOPIC FOCUS SUSPICIOUS FOR INVASION       NECROSIS: NOT IDENTIFIED       CALCIFICATIONS: NOT IDENTIFIED       DCIS LENGTH: 0.3 CM   Estrogen Receptor:  95%, POSITIVE, STRONG STAINING INTENSITY  Progesterone Receptor:  30%, POSITIVE, WEAK STAINING INTENSITY    IMPRESSION/PLAN:   Ms. Maultsby is a 89 yo F w/ a recent diagnosis of multifocal an at least Stage 0 [id number] ductal carcinoma in situ of the R breast, high grade, ER/PR+.  Patient  is being seen pre-operatively for consideration of adjuvant radiation therapy in setting of at least high grade DCIS. One biopsy had a microscopic focus suspicious for invasion and thus final pathology after surgery may very well reveal invasive ductal carcinoma. Either way, we discussed the role of breast irradiation in reducing the risk of local recurrence and improving long-term disease control.  We reviewed the logistics of treatment in detail, including the need for a CT simulation for treatment planning,  followed by several days for contouring, dosimetry, and quality assurance prior to starting therapy. Anticipated course of treatment will involve daily sessions, Monday through Friday, over approximately 3-4 weeks with moderate hypofractionated radiation favored over ultrafractionated radiation in setting of high grade disease requiring a boost. Each session will last only a few minutes although set up time is longer such that she should expect to be in the department around 45 minutes.  She will see me once a week to ensure she is safe to continue radiation and that we can manage any side effects she may be experiencing.  We discussed acute and subacute side effects which are generally gradual in onset and typically include fatigue skin erythema, tanning or hyperpigmentation, breast edema or firmness, mild tenderness.  Less common but possible side effects include desquamation of the skin, decreased range of motion of this shoulder, and transient changes in breast size texture.  Long-term risk such as cosmetic changes, rare risk of rib fracture, cardiopulmonary effects and very rare secondary malignancies were also reviewed.  Reassured patient that most acute side effects improve over weeks to months after completing therapy.    Patient verbalized understanding of these risks and benefits and she is in agreement with the plan. I will see her in follow-up after surgery. Final treatment planning will depend on her postoperative pathology report, but we anticipate adjuvant whole breast radiation. Do not anticipate the need to radiate her axilla or any nodal regions.   --  Total time spent today in preparation for this visit was *** minutes. This included patient care, imaging and path review, documentation, multidisciplinary discussion and coordination of care and follow up.    Estefana HERO. Maritza, M.D.

## 2024-07-23 ENCOUNTER — Ambulatory Visit
Admission: RE | Admit: 2024-07-23 | Discharge: 2024-07-23 | Disposition: A | Discharge status: Home / Self Care | Source: Ambulatory Visit | Attending: Radiation Oncology | Admitting: Radiation Oncology

## 2024-07-23 DIAGNOSIS — Z170 Estrogen receptor positive status [ER+]: Secondary | ICD-10-CM | POA: Diagnosis not present

## 2024-07-23 DIAGNOSIS — D0511 Intraductal carcinoma in situ of right breast: Secondary | ICD-10-CM | POA: Diagnosis not present

## 2024-07-23 NOTE — Addendum Note (Signed)
 Encounter addended by: Maritza Stagger, MD on: 07/23/2024 9:59 PM  Actions taken: Clinical Note Signed

## 2024-07-24 ENCOUNTER — Inpatient Hospital Stay

## 2024-07-24 ENCOUNTER — Encounter: Payer: Self-pay | Admitting: *Deleted

## 2024-07-24 ENCOUNTER — Inpatient Hospital Stay: Attending: Hematology and Oncology | Admitting: Hematology and Oncology

## 2024-07-24 VITALS — BP 177/63 | HR 80 | Temp 98.2°F | Resp 16 | Wt 111.5 lb

## 2024-07-24 DIAGNOSIS — D0511 Intraductal carcinoma in situ of right breast: Secondary | ICD-10-CM

## 2024-07-24 DIAGNOSIS — Z85820 Personal history of malignant melanoma of skin: Secondary | ICD-10-CM | POA: Diagnosis not present

## 2024-07-24 DIAGNOSIS — Z79899 Other long term (current) drug therapy: Secondary | ICD-10-CM | POA: Diagnosis not present

## 2024-07-24 DIAGNOSIS — M810 Age-related osteoporosis without current pathological fracture: Secondary | ICD-10-CM | POA: Insufficient documentation

## 2024-07-24 DIAGNOSIS — Z9049 Acquired absence of other specified parts of digestive tract: Secondary | ICD-10-CM | POA: Insufficient documentation

## 2024-07-24 DIAGNOSIS — Z885 Allergy status to narcotic agent status: Secondary | ICD-10-CM | POA: Diagnosis not present

## 2024-07-24 DIAGNOSIS — Z882 Allergy status to sulfonamides status: Secondary | ICD-10-CM | POA: Insufficient documentation

## 2024-07-24 DIAGNOSIS — L700 Acne vulgaris: Secondary | ICD-10-CM | POA: Diagnosis not present

## 2024-07-24 NOTE — Progress Notes (Signed)
 [facility] CONSULT NOTE  Patient Care Team: Gwenn Norris, MD as PCP - General (Family Medicine)  CHIEF COMPLAINTS/PURPOSE OF CONSULTATION:  Newly diagnosed DCIS  HISTORY OF PRESENTING ILLNESS:   History of Present Illness Cynthia Benitez is a 89 year old female with ductal carcinoma in situ (DCIS) who presents for consultation regarding her recent diagnosis and treatment options. She is accompanied by her daughter, Andres. She was referred by her primary care doctor after a physical examination raised concerns, leading to a mammogram and subsequent ultrasound.  A mammogram and ultrasound on November 5th revealed an indeterminate area of distortion measuring 8 millimeters and approximately 14 millimeters of calcification. Biopsy confirmed high-grade (grade 3) ductal carcinoma in situ (DCIS), estrogen receptor positive, and progesterone receptor positive. Imaging estimates the size of the area to be between 0.8 to 1.4 centimeters.  She has a history of stage one melanoma diagnosed 22 years ago. She experiences a heightened sense of smell and has noticed an unusual odor more on the right than the left breast. She also detected a thickening in the breast, described as 'a pad of something,' prompting further evaluation.  She is not on any medication and maintains a healthy lifestyle, avoiding alcohol and consuming plant-based estrogens like flax meal and tofu.     I reviewed her records extensively and collaborated the history with the patient.  SUMMARY OF ONCOLOGIC HISTORY: Oncology History  Ductal carcinoma in situ (DCIS) of right breast  07/16/2024 Initial Diagnosis   Right breast architectural distortion, 1.4 cm calcifications, ultrasound: Irregular hypoechoic mass 8 mm, axilla negative, biopsy: High-grade DCIS solid type with apocrine features, ER 95%, PR 5%    07/24/2024 Cancer Staging   Staging form: Breast, AJCC 8th Edition - Clinical: Stage 0 (cTis (DCIS), cN0,  cM0, G3, ER+, PR+, HER2: Not Assessed) - Signed by Odean Potts, MD on 07/24/2024 Stage prefix: Initial diagnosis Histologic grading system: 3 grade system      MEDICAL HISTORY:  Past Medical History:  Diagnosis Date   Osteoporosis     SURGICAL HISTORY: Past Surgical History:  Procedure Laterality Date   APPENDECTOMY     BREAST BIOPSY Right 07/06/2024   US  RT BREAST BX W LOC DEV 1ST LESION IMG BX SPEC US  GUIDE 07/06/2024 [facility]   BREAST BIOPSY Right 07/16/2024   MM RT BREAST BX W LOC DEV 1ST LESION IMAGE BX SPEC STEREO GUIDE 07/16/2024 [facility]   BREAST BIOPSY Right 07/16/2024   US  RT PLC BREAST LOC DEV   1ST LESION  INC US  GUIDE 07/16/2024 [facility]    SOCIAL HISTORY: Social History   Socioeconomic History   Marital status: Married    Spouse name: Not on file   Number of children: Not on file   Years of education: Not on file   Highest education level: Not on file  Occupational History   Not on file  Tobacco Use   Smoking status: Never   Smokeless tobacco: Never  Substance and Sexual Activity   Alcohol use: No   Drug use: No   Sexual activity: Not on file  Other Topics Concern   Not on file  Social History Narrative   Not on file   Social Drivers of Health   Financial Resource Strain: Not on file  Food Insecurity: Not on file  Transportation Needs: Not on file  Physical Activity: Not on file  Stress: Not on file  Social Connections: Not on file  Intimate Partner Violence: Not  At Risk (07/23/2024)   Humiliation, Afraid, Rape, and Kick questionnaire    Fear of Current or Ex-Partner: No    Emotionally Abused: No    Physically Abused: No    Sexually Abused: No    FAMILY HISTORY: No family history on file.  ALLERGIES:  is allergic to codeine and sulfa antibiotics.  MEDICATIONS:  Current Outpatient Medications  Medication Sig Dispense Refill   cholecalciferol (VITAMIN D3) 25 MCG (1000 UNIT) tablet 2 tablets Orally once daily      Cyanocobalamin  (VITAMIN B12) 1000 MCG TBCR 1 tablet Orally Once a day     doxycycline (MONODOX) 100 MG capsule Take 100 mg by mouth 2 (two) times daily.     Omega-3 Fatty Acids (FISH OIL) 1000 MG CAPS Take 2 capsules by mouth daily.     No current facility-administered medications for this visit.    REVIEW OF SYSTEMS:   Constitutional: Denies fevers, chills or abnormal night sweats Breast:  Denies any palpable lumps or discharge All other systems were reviewed with the patient and are negative.  PHYSICAL EXAMINATION: ECOG PERFORMANCE STATUS: 0 - Asymptomatic  Vitals:   07/24/24 1551  BP: (!) 177/63  Pulse: 80  Resp: 16  Temp: 98.2 F (36.8 C)  SpO2: 99%   Filed Weights   07/24/24 1551  Weight: 111 lb 8 oz (50.6 kg)    GENERAL:alert, no distress and comfortable     RADIOGRAPHIC STUDIES: I have personally reviewed the radiological reports and agreed with the findings in the report.  ASSESSMENT AND PLAN:  Ductal carcinoma in situ (DCIS) of right breast 07/16/2024: Right breast architectural distortion, 1.4 cm calcifications, ultrasound: Irregular hypoechoic mass 8 mm, axilla negative, biopsy: High-grade DCIS solid type with apocrine features, ER 95%, PR 5%  Pathology review: I discussed with the patient the difference between DCIS and invasive breast cancer. It is considered a precancerous lesion. DCIS is classified as a 0. It is generally detected through mammograms as calcifications. We discussed the significance of grades and its impact on prognosis. We also discussed the importance of ER and PR receptors and their implications to adjuvant treatment options. Prognosis of DCIS dependence on grade, comedo necrosis. It is anticipated that if not treated, 20-30% of DCIS can develop into invasive breast cancer.  Recommendation: 1. Breast conserving surgery 2. +/- adjuvant radiation therapy 3.  When her age I did not recommend antiestrogen therapy.  Return to clinic after  surgery to discuss the final pathology report and come up with an adjuvant treatment plan.      All questions were answered. The patient knows to call the clinic with any problems, questions or concerns.    Viinay K Capricia Serda, MD 07/24/24

## 2024-07-24 NOTE — Assessment & Plan Note (Signed)
 07/16/2024: Right breast architectural distortion, 1.4 cm calcifications, ultrasound: Irregular hypoechoic mass 8 mm, axilla negative, biopsy: High-grade DCIS solid type with apocrine features, ER 95%, PR 5%  Pathology review: I discussed with the patient the difference between DCIS and invasive breast cancer. It is considered a precancerous lesion. DCIS is classified as a 0. It is generally detected through mammograms as calcifications. We discussed the significance of grades and its impact on prognosis. We also discussed the importance of ER and PR receptors and their implications to adjuvant treatment options. Prognosis of DCIS dependence on grade, comedo necrosis. It is anticipated that if not treated, 20-30% of DCIS can develop into invasive breast cancer.  Recommendation: 1. Breast conserving surgery 2. Followed by adjuvant radiation therapy 3. Followed by antiestrogen therapy with tamoxifen 5 years  Tamoxifen counseling: We discussed the risks and benefits of tamoxifen. These include but not limited to insomnia, hot flashes, mood changes, vaginal dryness, and weight gain. Although rare, serious side effects including endometrial cancer, risk of blood clots were also discussed. We strongly believe that the benefits far outweigh the risks. Patient understands these risks and consented to starting treatment. Planned treatment duration is 5 years.  Return to clinic after surgery to discuss the final pathology report and come up with an adjuvant treatment plan.

## 2024-07-26 ENCOUNTER — Telehealth: Payer: Self-pay | Admitting: Radiation Oncology

## 2024-07-26 NOTE — Telephone Encounter (Signed)
 LVM to schedule FUN/SIM with Dr. Maritza

## 2024-07-27 ENCOUNTER — Telehealth: Payer: Self-pay | Admitting: Radiation Oncology

## 2024-07-27 NOTE — Telephone Encounter (Signed)
 LVM to schedule FUN/SIM with Dr. Maritza

## 2024-07-30 ENCOUNTER — Telehealth: Payer: Self-pay | Admitting: Radiation Oncology

## 2024-07-30 NOTE — Telephone Encounter (Signed)
 LVM to schedule consult.   Sent unable to contact letter to [address] [place] [address]

## 2024-08-03 ENCOUNTER — Other Ambulatory Visit: Attending: Hematology and Oncology

## 2024-08-03 ENCOUNTER — Other Ambulatory Visit: Payer: Self-pay | Admitting: *Deleted

## 2024-08-03 DIAGNOSIS — D0511 Intraductal carcinoma in situ of right breast: Secondary | ICD-10-CM | POA: Insufficient documentation

## 2024-08-03 DIAGNOSIS — Z79899 Other long term (current) drug therapy: Secondary | ICD-10-CM | POA: Diagnosis not present

## 2024-08-03 LAB — CBC WITH DIFFERENTIAL (CANCER CENTER ONLY)
Abs Immature Granulocytes: 0.02 K/uL (ref 0.00–0.07)
Basophils Absolute: 0.1 K/uL (ref 0.0–0.1)
Basophils Relative: 1 %
Eosinophils Absolute: 0.1 K/uL (ref 0.0–0.5)
Eosinophils Relative: 1 %
HCT: 40.7 % (ref 36.0–46.0)
Hemoglobin: 13.9 g/dL (ref 12.0–15.0)
Immature Granulocytes: 0 %
Lymphocytes Relative: 14 %
Lymphs Abs: 1.2 K/uL (ref 0.7–4.0)
MCH: 30.5 pg (ref 26.0–34.0)
MCHC: 34.2 g/dL (ref 30.0–36.0)
MCV: 89.3 fL (ref 80.0–100.0)
Monocytes Absolute: 0.6 K/uL (ref 0.1–1.0)
Monocytes Relative: 7 %
Neutro Abs: 6.6 K/uL (ref 1.7–7.7)
Neutrophils Relative %: 77 %
Platelet Count: 127 K/uL — ABNORMAL LOW (ref 150–400)
RBC: 4.56 MIL/uL (ref 3.87–5.11)
RDW: 13 % (ref 11.5–15.5)
Smear Review: NORMAL
WBC Count: 8.6 K/uL (ref 4.0–10.5)
nRBC: 0 % (ref 0.0–0.2)

## 2024-08-03 LAB — CMP (CANCER CENTER ONLY)
ALT: 15 U/L (ref 0–44)
AST: 25 U/L (ref 15–41)
Albumin: 4.3 g/dL (ref 3.5–5.0)
Alkaline Phosphatase: 64 U/L (ref 38–126)
Anion gap: 6 (ref 5–15)
BUN: 15 mg/dL (ref 8–23)
CO2: 29 mmol/L (ref 22–32)
Calcium: 10 mg/dL (ref 8.9–10.3)
Chloride: 99 mmol/L (ref 98–111)
Creatinine: 0.69 mg/dL (ref 0.44–1.00)
GFR, Estimated: >60 mL/min (ref >60)
Glucose, Bld: 131 mg/dL — ABNORMAL HIGH (ref 70–99)
Potassium: 5.2 mmol/L — ABNORMAL HIGH (ref 3.5–5.1)
Sodium: 134 mmol/L — ABNORMAL LOW (ref 135–145)
Total Bilirubin: 0.6 mg/dL (ref 0.0–1.2)
Total Protein: 7 g/dL (ref 6.5–8.1)

## 2024-08-08 ENCOUNTER — Telehealth: Payer: Self-pay | Admitting: Radiation Oncology

## 2024-08-08 NOTE — Telephone Encounter (Signed)
 10/15 patient's daughter called about patient follow up appt.  She requested new physician due to her mother's perception of last visit on mychart video. Inbasket sent to Dr. Maritza copied Dr. Nome, so they are aware.

## 2024-08-11 ENCOUNTER — Other Ambulatory Visit (HOSPITAL_BASED_OUTPATIENT_CLINIC_OR_DEPARTMENT_OTHER): Payer: Self-pay

## 2024-08-11 DIAGNOSIS — Z23 Encounter for immunization: Secondary | ICD-10-CM | POA: Diagnosis not present

## 2024-08-11 MED ORDER — FLUZONE HIGH-DOSE 0.5 ML IM SUSY
0.5000 mL | PREFILLED_SYRINGE | Freq: Once | INTRAMUSCULAR | 0 refills | Status: AC
  Filled 2024-08-11: qty 0.5, 1d supply, fill #0

## 2024-08-17 ENCOUNTER — Other Ambulatory Visit: Payer: Self-pay

## 2024-08-17 ENCOUNTER — Encounter (HOSPITAL_BASED_OUTPATIENT_CLINIC_OR_DEPARTMENT_OTHER)
Admission: RE | Admit: 2024-08-17 | Discharge: 2024-08-17 | Disposition: A | Discharge status: Home / Self Care | Source: Ambulatory Visit | Attending: General Surgery | Admitting: General Surgery

## 2024-08-17 ENCOUNTER — Encounter (HOSPITAL_BASED_OUTPATIENT_CLINIC_OR_DEPARTMENT_OTHER): Payer: Self-pay | Admitting: General Surgery

## 2024-08-17 DIAGNOSIS — Z01812 Encounter for preprocedural laboratory examination: Secondary | ICD-10-CM | POA: Diagnosis not present

## 2024-08-17 LAB — BASIC METABOLIC PANEL WITH GFR
Anion gap: 10 (ref 5–15)
BUN: 18 mg/dL (ref 8–23)
CO2: 30 mmol/L (ref 22–32)
Calcium: 9.6 mg/dL (ref 8.9–10.3)
Chloride: 96 mmol/L — ABNORMAL LOW (ref 98–111)
Creatinine, Ser: 0.77 mg/dL (ref 0.44–1.00)
GFR, Estimated: >60 mL/min (ref >60)
Glucose, Bld: 137 mg/dL — ABNORMAL HIGH (ref 70–99)
Potassium: 5.1 mmol/L (ref 3.5–5.1)
Sodium: 136 mmol/L (ref 135–145)

## 2024-08-17 MED ORDER — CHLORHEXIDINE GLUCONATE CLOTH 2 % EX PADS: 6.0000 | MEDICATED_PAD | Freq: Once | CUTANEOUS | Status: DC

## 2024-08-17 NOTE — Progress Notes (Signed)
      Enhanced Recovery after Surgery  Enhanced Recovery after Surgery is a protocol used to improve the stress on your body and your recovery after surgery.  Patient Instructions  The night before surgery:  No food after midnight. ONLY clear liquids after midnight  The day of surgery (if you do NOT have diabetes):  Drink ONE (1) Pre-Surgery Clear Ensure as directed.   This drink was given to you during your hospital  pre-op appointment visit. The pre-op nurse will instruct you on the time to drink the  Pre-Surgery Ensure depending on your surgery time. Finish the drink at the designated time by the pre-op nurse.  Nothing else to drink after completing the  Pre-Surgery Clear Ensure.  The day of surgery (if you have diabetes): Drink ONE (1) Gatorade 2 (G2) as directed. This drink was given to you during your hospital  pre-op appointment visit.  The pre-op nurse will instruct you on the time to drink the   Gatorade 2 (G2) depending on your surgery time. Color of the Gatorade may vary. Red is not allowed. Nothing else to drink after completing the  Gatorade 2 (G2).         If you have questions, please contact your surgeon's office.   Pre surgical soap given with instructions

## 2024-08-21 ENCOUNTER — Ambulatory Visit
Admission: RE | Admit: 2024-08-21 | Discharge: 2024-08-21 | Disposition: A | Source: Ambulatory Visit | Attending: General Surgery | Admitting: General Surgery

## 2024-08-21 DIAGNOSIS — D0511 Intraductal carcinoma in situ of right breast: Secondary | ICD-10-CM | POA: Diagnosis not present

## 2024-08-21 DIAGNOSIS — R921 Mammographic calcification found on diagnostic imaging of breast: Secondary | ICD-10-CM | POA: Diagnosis not present

## 2024-08-21 HISTORY — PX: BREAST BIOPSY: SHX20

## 2024-08-24 ENCOUNTER — Ambulatory Visit
Admission: RE | Admit: 2024-08-24 | Discharge: 2024-08-24 | Disposition: A | Discharge status: Home / Self Care | Source: Ambulatory Visit | Attending: General Surgery | Admitting: General Surgery

## 2024-08-24 ENCOUNTER — Other Ambulatory Visit: Payer: Self-pay

## 2024-08-24 ENCOUNTER — Encounter (HOSPITAL_BASED_OUTPATIENT_CLINIC_OR_DEPARTMENT_OTHER): Payer: Self-pay | Admitting: General Surgery

## 2024-08-24 ENCOUNTER — Ambulatory Visit (HOSPITAL_BASED_OUTPATIENT_CLINIC_OR_DEPARTMENT_OTHER): Admitting: Certified Registered"

## 2024-08-24 ENCOUNTER — Ambulatory Visit (HOSPITAL_BASED_OUTPATIENT_CLINIC_OR_DEPARTMENT_OTHER)
Admission: RE | Admit: 2024-08-24 | Discharge: 2024-08-24 | Disposition: A | Discharge status: Home / Self Care | Attending: General Surgery | Admitting: General Surgery

## 2024-08-24 ENCOUNTER — Encounter (HOSPITAL_BASED_OUTPATIENT_CLINIC_OR_DEPARTMENT_OTHER)
Admission: RE | Disposition: A | Discharge status: Home / Self Care | Payer: Self-pay | Source: Home / Self Care | Attending: General Surgery

## 2024-08-24 DIAGNOSIS — D0511 Intraductal carcinoma in situ of right breast: Secondary | ICD-10-CM | POA: Diagnosis not present

## 2024-08-24 DIAGNOSIS — Z170 Estrogen receptor positive status [ER+]: Secondary | ICD-10-CM | POA: Diagnosis not present

## 2024-08-24 DIAGNOSIS — N6031 Fibrosclerosis of right breast: Secondary | ICD-10-CM | POA: Diagnosis not present

## 2024-08-24 DIAGNOSIS — Z01818 Encounter for other preprocedural examination: Secondary | ICD-10-CM

## 2024-08-24 HISTORY — PX: BREAST LUMPECTOMY WITH RADIOACTIVE SEED LOCALIZATION: SHX6424

## 2024-08-24 SURGERY — BREAST LUMPECTOMY WITH RADIOACTIVE SEED LOCALIZATION
Anesthesia: General | Site: Breast | Laterality: Right

## 2024-08-24 MED ORDER — BUPIVACAINE-EPINEPHRINE (PF) 0.25% -1:200000 IJ SOLN
INTRAMUSCULAR | Status: DC | PRN
  Administered 2024-08-24: 20 mL via PERINEURAL

## 2024-08-24 MED ORDER — ACETAMINOPHEN 500 MG PO TABS
ORAL_TABLET | ORAL | Status: AC
  Filled 2024-08-24: qty 2

## 2024-08-24 MED ORDER — LIDOCAINE HCL (CARDIAC) PF 100 MG/5ML IV SOSY
PREFILLED_SYRINGE | INTRAVENOUS | Status: DC | PRN
  Administered 2024-08-24: 50 mg via INTRAVENOUS

## 2024-08-24 MED ORDER — GABAPENTIN 100 MG PO CAPS
100.0000 mg | ORAL_CAPSULE | ORAL | Status: AC
  Administered 2024-08-24: 100 mg via ORAL

## 2024-08-24 MED ORDER — FENTANYL CITRATE (PF) 100 MCG/2ML IJ SOLN
INTRAMUSCULAR | Status: AC
  Filled 2024-08-24: qty 2

## 2024-08-24 MED ORDER — TRAMADOL HCL 50 MG PO TABS: 50.0000 mg | ORAL_TABLET | Freq: Four times a day (QID) | ORAL | 0 refills | Status: AC | PRN

## 2024-08-24 MED ORDER — PROPOFOL 10 MG/ML IV BOLUS
INTRAVENOUS | Status: DC | PRN
  Administered 2024-08-24: 70 mg via INTRAVENOUS
  Administered 2024-08-24: 20 mg via INTRAVENOUS

## 2024-08-24 MED ORDER — DEXAMETHASONE SOD PHOSPHATE PF 10 MG/ML IJ SOLN
INTRAMUSCULAR | Status: DC | PRN
  Administered 2024-08-24: 4 mg via INTRAVENOUS

## 2024-08-24 MED ORDER — EPHEDRINE SULFATE (PRESSORS) 25 MG/5ML IV SOSY
PREFILLED_SYRINGE | INTRAVENOUS | Status: DC | PRN
  Administered 2024-08-24 (×2): 10 mg via INTRAVENOUS

## 2024-08-24 MED ORDER — ONDANSETRON HCL 4 MG/2ML IJ SOLN
INTRAMUSCULAR | Status: AC
  Filled 2024-08-24: qty 2

## 2024-08-24 MED ORDER — ACETAMINOPHEN 500 MG PO TABS
1000.0000 mg | ORAL_TABLET | ORAL | Status: AC
  Administered 2024-08-24: 1000 mg via ORAL

## 2024-08-24 MED ORDER — GABAPENTIN 100 MG PO CAPS
ORAL_CAPSULE | ORAL | Status: AC
  Filled 2024-08-24: qty 1

## 2024-08-24 MED ORDER — LIDOCAINE 2% (20 MG/ML) 5 ML SYRINGE
INTRAMUSCULAR | Status: AC
  Filled 2024-08-24: qty 5

## 2024-08-24 MED ORDER — 0.9 % SODIUM CHLORIDE (POUR BTL) OPTIME
TOPICAL | Status: DC | PRN
  Administered 2024-08-24: 1000 mL

## 2024-08-24 MED ORDER — FENTANYL CITRATE (PF) 100 MCG/2ML IJ SOLN
INTRAMUSCULAR | Status: DC | PRN
  Administered 2024-08-24 (×2): 25 ug via INTRAVENOUS

## 2024-08-24 MED ORDER — ONDANSETRON HCL 4 MG/2ML IJ SOLN
INTRAMUSCULAR | Status: DC | PRN
  Administered 2024-08-24: 4 mg via INTRAVENOUS

## 2024-08-24 MED ORDER — LACTATED RINGERS IV SOLN
INTRAVENOUS | Status: DC
Start: 2024-08-24 — End: 2024-08-24

## 2024-08-24 MED ORDER — ONDANSETRON HCL 4 MG/2ML IJ SOLN: 4.0000 mg | Freq: Once | INTRAMUSCULAR | Status: DC | PRN

## 2024-08-24 MED ORDER — CEFAZOLIN SODIUM-DEXTROSE 2-4 GM/100ML-% IV SOLN
2.0000 g | INTRAVENOUS | Status: AC
  Administered 2024-08-24: 2 g via INTRAVENOUS

## 2024-08-24 MED ORDER — FENTANYL CITRATE (PF) 100 MCG/2ML IJ SOLN: 25.0000 ug | INTRAMUSCULAR | Status: DC | PRN

## 2024-08-24 MED ORDER — CEFAZOLIN SODIUM-DEXTROSE 2-4 GM/100ML-% IV SOLN
INTRAVENOUS | Status: AC
  Filled 2024-08-24: qty 100

## 2024-08-24 MED ORDER — PHENYLEPHRINE HCL (PRESSORS) 10 MG/ML IV SOLN
INTRAVENOUS | Status: DC | PRN
  Administered 2024-08-24: 80 ug via INTRAVENOUS

## 2024-08-24 SURGICAL SUPPLY — 32 items
BLADE SURG 15 STRL LF DISP TIS (BLADE) ×1 IMPLANT
CANISTER SUC SOCK COL 7IN (MISCELLANEOUS) ×1 IMPLANT
CANISTER SUCT 1200ML W/VALVE (MISCELLANEOUS) ×1 IMPLANT
CHLORAPREP W/TINT 26 (MISCELLANEOUS) ×1 IMPLANT
CLIP APPLIE 9.375 MED OPEN (MISCELLANEOUS) IMPLANT
COVER BACK TABLE 60X90IN (DRAPES) ×1 IMPLANT
COVER MAYO STAND STRL (DRAPES) ×1 IMPLANT
COVER PROBE CYLINDRICAL 5X96 (MISCELLANEOUS) ×1 IMPLANT
DERMABOND ADVANCED .7 DNX12 (GAUZE/BANDAGES/DRESSINGS) ×1 IMPLANT
DRAPE LAPAROSCOPIC ABDOMINAL (DRAPES) ×1 IMPLANT
DRAPE UTILITY XL STRL (DRAPES) ×1 IMPLANT
ELECT COATED BLADE 2.86 ST (ELECTRODE) ×1 IMPLANT
ELECTRODE REM PT RTRN 9FT ADLT (ELECTROSURGICAL) ×1 IMPLANT
GLOVE BIO SURGEON STRL SZ7.5 (GLOVE) ×2 IMPLANT
GOWN STRL REUS W/ TWL LRG LVL3 (GOWN DISPOSABLE) ×2 IMPLANT
KIT MARKER MARGIN INK (KITS) ×1 IMPLANT
NDL HYPO 25X1 1.5 SAFETY (NEEDLE) IMPLANT
NEEDLE HYPO 25X1 1.5 SAFETY (NEEDLE) IMPLANT
PACK BASIN DAY SURGERY FS (CUSTOM PROCEDURE TRAY) ×1 IMPLANT
PENCIL SMOKE EVACUATOR (MISCELLANEOUS) ×1 IMPLANT
SLEEVE SCD COMPRESS KNEE MED (STOCKING) ×1 IMPLANT
SOLN 0.9% NACL POUR BTL 1000ML (IV SOLUTION) IMPLANT
SPIKE FLUID TRANSFER (MISCELLANEOUS) IMPLANT
SPONGE T-LAP 18X18 ~~LOC~~+RFID (SPONGE) ×1 IMPLANT
SUT MON AB 4-0 PC3 18 (SUTURE) ×1 IMPLANT
SUT SILK 2 0 SH (SUTURE) IMPLANT
SUT VICRYL 3-0 CR8 SH (SUTURE) ×1 IMPLANT
SYR CONTROL 10ML LL (SYRINGE) IMPLANT
TOWEL GREEN STERILE FF (TOWEL DISPOSABLE) ×1 IMPLANT
TRAY FAXITRON CT DISP (TRAY / TRAY PROCEDURE) ×1 IMPLANT
TUBE CONNECTING 20X1/4 (TUBING) ×1 IMPLANT
YANKAUER SUCT BULB TIP NO VENT (SUCTIONS) IMPLANT

## 2024-08-24 NOTE — Transfer of Care (Signed)
 Immediate Anesthesia Transfer of Care Note  Patient: Cynthia Benitez  Procedure(s) Performed: BREAST LUMPECTOMY WITH RADIOACTIVE SEED LOCALIZATION (Right: Breast)  Patient Location: PACU  Anesthesia Type:General  Level of Consciousness: sedated  Airway & Oxygen Therapy: Patient Spontanous Breathing and Patient connected to face mask oxygen  Post-op Assessment: Report given to RN and Post -op Vital signs reviewed and stable  Post vital signs: Reviewed and stable  Last Vitals:  Vitals Value Taken Time  BP    Temp    Pulse    Resp    SpO2      Last Pain:  Vitals:   08/24/24 1224  TempSrc: Temporal  PainSc: 0-No pain         Complications: No notable events documented.

## 2024-08-24 NOTE — Anesthesia Postprocedure Evaluation (Signed)
 Anesthesia Post Note  Patient: Cynthia Benitez  Procedure(s) Performed: BREAST LUMPECTOMY WITH RADIOACTIVE SEED LOCALIZATION (Right: Breast)     Patient location during evaluation: PACU Anesthesia Type: General Level of consciousness: awake and alert Pain management: pain level controlled Vital Signs Assessment: post-procedure vital signs reviewed and stable Respiratory status: spontaneous breathing, nonlabored ventilation and respiratory function stable Cardiovascular status: blood pressure returned to baseline and stable Postop Assessment: no apparent nausea or vomiting Anesthetic complications: no   No notable events documented.  Last Vitals:  Vitals:   08/24/24 1414 08/24/24 1415  BP: 133/68 131/64  Pulse:  90  Resp:  16  Temp: (!) 36.3 C   SpO2:  99%    Last Pain:  Vitals:   08/24/24 1415  TempSrc:   PainSc: 0-No pain                 Garnette FORBES Skillern

## 2024-08-24 NOTE — Interval H&P Note (Signed)
 History and Physical Interval Note:  08/24/2024 12:47 PM  Cynthia Benitez  has presented today for surgery, with the diagnosis of RIGHT BREAST DUCTAL CARCINOMA IN SITU.  The various methods of treatment have been discussed with the patient and family. After consideration of risks, benefits and other options for treatment, the patient has consented to  Procedure(s) with comments: BREAST LUMPECTOMY WITH RADIOACTIVE SEED LOCALIZATION (Right) - RIGHT BREAST BRACKETED RADIOACTIVE SEED LOCALIZED LUMPECTOMY as a surgical intervention.  The patient's history has been reviewed, patient examined, no change in status, stable for surgery.  I have reviewed the patient's chart and labs.  Questions were answered to the patient's satisfaction.     Deward Null III

## 2024-08-24 NOTE — H&P (Signed)
 REFERRING PHYSICIAN: Rolinda Benitez PROVIDER: DEWARD GARNETTE NULL, MD MRN: [id number] DOB: Mar 22, 1931 Subjective   Chief Complaint: NEW BREAST CANCER  History of Present Illness: Cynthia Benitez is a 89 y.o. female who is seen today as an office consultation for evaluation of NEW BREAST CANCER  We are asked to see the patient in consultation by Dr. Millman Cynthia to evaluate her for a new right breast cancer. The patient is a 89 year old white female who recently noticed some distortion near her axilla. She brought this to her medical doctor's attention and was evaluated with mammogram and ultrasound. She was found to have a 1.4 cm area of distortion in the outer right breast with 8 mm of calcification anterior to this. Both were biopsied and came back as ductal carcinoma in situ that was ER and PR positive. She has no family history of breast cancer. She is in great shape for 89 and does not really take any medicines. She does not smoke.  Review of Systems: A complete review of systems was obtained from the patient. I have reviewed this information and discussed as appropriate with the patient. See HPI as well for other ROS.  ROS   Medical History: History reviewed. No pertinent past medical history.  Patient Active Problem List  Diagnosis  Ductal carcinoma in situ (DCIS) of right breast   Past Surgical History:  Procedure Laterality Date  APPENDECTOMY    Allergies  Allergen Reactions  Codeine Rash  Rash Facial swelling  Sulfamethoxazole Swelling and Rash   No current outpatient medications on file prior to visit.   No current facility-administered medications on file prior to visit.   Family History  Problem Relation Age of Onset  High blood pressure (Hypertension) Father  Coronary Artery Disease (Blocked arteries around heart) Father  High blood pressure (Hypertension) Brother    Social History   Tobacco Use  Smoking Status Never  Smokeless Tobacco Never    Social  History   Socioeconomic History  Marital status: Married  Tobacco Use  Smoking status: Never  Smokeless tobacco: Never  Substance and Sexual Activity  Alcohol use: Never  Drug use: Never   Social Drivers of Health   Housing Stability: Unknown Housing Stability Vital Sign  Homeless in the Last Year: No   Objective:   Vitals:  BP: 138/70  Pulse: 107  Temp: 37.2 C (99 F)  SpO2: 97%  Weight: 50.2 kg (110 lb 9.6 oz)  Height: 157.5 cm (5' 2)  PainSc: 0-No pain  PainLoc: Breast   Body mass index is 20.23 kg/m.  Physical Exam Vitals reviewed.  Constitutional:  General: She is not in acute distress. Appearance: Normal appearance.  HENT:  Head: Normocephalic and atraumatic.  Right Ear: External ear normal.  Left Ear: External ear normal.  Nose: Nose normal.  Mouth/Throat:  Mouth: Mucous membranes are moist.  Pharynx: Oropharynx is clear.  Eyes:  General: No scleral icterus. Extraocular Movements: Extraocular movements intact.  Conjunctiva/sclera: Conjunctivae normal.  Pupils: Pupils are equal, round, and reactive to light.  Cardiovascular:  Rate and Rhythm: Normal rate and regular rhythm.  Pulses: Normal pulses.  Heart sounds: Normal heart sounds.  Pulmonary:  Effort: Pulmonary effort is normal. No respiratory distress.  Breath sounds: Normal breath sounds.  Abdominal:  General: Bowel sounds are normal.  Palpations: Abdomen is soft.  Tenderness: There is no abdominal tenderness.  Musculoskeletal:  General: No swelling, tenderness or deformity. Normal range of motion.  Cervical back: Normal range of motion and neck supple.  Skin: General: Skin is warm and dry.  Coloration: Skin is not jaundiced.  Neurological:  General: No focal deficit present.  Mental Status: She is alert and oriented to person, place, and time.  Psychiatric:  Mood and Affect: Mood normal.  Behavior: Behavior normal.     Breast: There is a palpable bruise with some cellulitis in  the outer aspect of the right breast related to the biopsy. Other than this there is no palpable mass in either breast. There is no palpable axillary, supraclavicular, or cervical lymphadenopathy.  Labs, Imaging and Diagnostic Testing:  Assessment and Plan:   Diagnoses and all orders for this visit:  Ductal carcinoma in situ (DCIS) of right breast - Ambulatory Referral to Oncology-Medical - Ambulatory Referral to Radiation Oncology - [facility] Case Posting Request; Future - doxycycline (MONODOX) 100 MG capsule; Take 1 capsule (100 mg total) by mouth 2 (two) times daily for 10 days   The patient appears to have a moderate size area of ductal carcinoma in situ in the outer aspect of the right breast. I have discussed with her in detail the different options for treatment and at this point she favors breast conservation which I feel is very reasonable. She will not need a node evaluation. I have discussed with her in detail the risks and benefits of the operation as well as some of the technical aspects including use of radioactive seeds to bracket the area and she understands and wishes to proceed. I will go ahead and refer her to medical and radiation oncology to discuss adjuvant therapy. We will move forward with surgical scheduling

## 2024-08-24 NOTE — Anesthesia Procedure Notes (Signed)
 Procedure Name: LMA Insertion Date/Time: 08/24/2024 1:15 PM  Performed by: Emilio Rock BIRCH, CRNAPre-anesthesia Checklist: Patient identified, Emergency Drugs available, Suction available and Patient being monitored Patient Re-evaluated:Patient Re-evaluated prior to induction Oxygen Delivery Method: Circle System Utilized Preoxygenation: Pre-oxygenation with 100% oxygen Induction Type: IV induction Ventilation: Mask ventilation without difficulty LMA: LMA inserted LMA Size: 3.0 Number of attempts: 1 Airway Equipment and Method: bite block Placement Confirmation: positive ETCO2 Tube secured with: Tape Dental Injury: Teeth and Oropharynx as per pre-operative assessment

## 2024-08-24 NOTE — Op Note (Signed)
 08/24/2024  2:05 PM  PATIENT:  Cynthia Benitez  89 y.o. female  PRE-OPERATIVE DIAGNOSIS:  RIGHT BREAST DUCTAL CARCINOMA IN SITU  POST-OPERATIVE DIAGNOSIS:  RIGHT BREAST DUCTAL CARCINOMA IN SITU  PROCEDURE:  Procedure(s) with comments: RIGHT BREAST BRACKETED RADIOACTIVE SEED LOCALIZED LUMPECTOMY  SURGEON:  Surgeons and Role:    * Curvin Deward MOULD, MD - Primary  PHYSICIAN ASSISTANT:   ASSISTANTS: none   ANESTHESIA:   local and general  EBL:  5 mL   BLOOD ADMINISTERED:none  DRAINS: none   LOCAL MEDICATIONS USED:  MARCAINE     SPECIMEN:  Source of Specimen:  right breast tissue with additional medial margin  DISPOSITION OF SPECIMEN:  PATHOLOGY  COUNTS:  YES  TOURNIQUET:  * No tourniquets in log *  DICTATION: .Dragon Dictation  After informed consent was obtained the patient was brought to the operating room and placed in the supine position on the operating table.  After adequate induction of general anesthesia the patient's right breast was prepped with ChloraPrep, allowed to dry, and draped in usual sterile manner.  An appropriate timeout was performed.  Previously 2 I-125 seeds were placed in the outer aspect of the right breast to bracket an area of ductal carcinoma in situ.  The neoprobe was set to I-125 by the 2 radioactive seeds were readily identified.  The area around this was infiltrated with quarter percent Marcaine.  I elected to make an elliptical incision in the skin radially and laterally overlying the 2 radioactive seeds.  The incision was carried through the skin and subcutaneous tissue sharply with electrocautery.  Dissection was then carried around the radioactive seeds while checking the area of radioactivity frequently.  The dissection was carried all the way to the muscle of the chest wall posteriorly.  Once the tissue was removed it was oriented with the appropriate paint colors.  A specimen radiograph was obtained that showed the 2 seeds and clip were within  the specimen.  The second clip had migrated away from the area and was not included I did elect to take an additional medial margin and this was also marked appropriately.  All of this tissue was then sent to pathology for further evaluation.  Hemostasis was achieved using the Bovie electrocautery.  The wound was irrigated with saline and infiltrated with more quarter percent Marcaine.  The deep layer of the wound was then closed with layers of interrupted 3-0 Vicryl stitches.  The skin was closed with a running 4-0 Monocryl subcuticular stitch.  Dermabond dressings were applied.  The patient tolerated the procedure well.  At the end of the case all needle sponge and instrument counts were correct.  The patient was then awakened and taken to recovery in stable condition.  PLAN OF CARE: Discharge to home after PACU  PATIENT DISPOSITION:  PACU - hemodynamically stable.   Delay start of Pharmacological VTE agent (>24hrs) due to surgical blood loss or risk of bleeding: not applicable

## 2024-08-24 NOTE — Anesthesia Preprocedure Evaluation (Addendum)
 Anesthesia Evaluation  Patient identified by MRN, date of birth, ID band Patient awake    Reviewed: Allergy & Precautions, NPO status , Patient's Chart, lab work & pertinent test results  History of Anesthesia Complications Negative for: history of anesthetic complications  Airway Mallampati: II  TM Distance: >3 FB Neck ROM: Full    Dental  (+) Teeth Intact, Dental Advisory Given   Pulmonary neg pulmonary ROS   Pulmonary exam normal breath sounds clear to auscultation       Cardiovascular Exercise Tolerance: Good (-) hypertension(-) angina (-) Past MI negative cardio ROS Normal cardiovascular exam Rhythm:Regular Rate:Normal     Neuro/Psych negative neurological ROS  negative psych ROS   GI/Hepatic negative GI ROS, Neg liver ROS,,,  Endo/Other  negative endocrine ROS    Renal/GU negative Renal ROS     Musculoskeletal negative musculoskeletal ROS (+)    Abdominal   Peds  Hematology negative hematology ROS (+)   Anesthesia Other Findings Day of surgery medications reviewed with the patient.  RIGHT BREAST DUCTAL CARCINOMA IN SITU  Reproductive/Obstetrics                              Anesthesia Physical Anesthesia Plan  ASA: 3  Anesthesia Plan: General   Post-op Pain Management: Tylenol PO (pre-op)* and Gabapentin PO (pre-op)*   Induction: Intravenous  PONV Risk Score and Plan: 3 and Dexamethasone and Ondansetron  Airway Management Planned: LMA  Additional Equipment:   Intra-op Plan:   Post-operative Plan: Extubation in OR  Informed Consent: I have reviewed the patients History and Physical, chart, labs and discussed the procedure including the risks, benefits and alternatives for the proposed anesthesia with the patient or authorized representative who has indicated his/her understanding and acceptance.     Dental advisory given  Plan Discussed with: CRNA  Anesthesia  Plan Comments:          Anesthesia Quick Evaluation

## 2024-08-24 NOTE — Discharge Instructions (Signed)
No tylenol until 6:30 p.m.  Post Anesthesia Home Care Instructions  Activity: Get plenty of rest for the remainder of the day. A responsible individual must stay with you for 24 hours following the procedure.  For the next 24 hours, DO NOT: -Drive a car -Operate machinery -Drink alcoholic beverages -Take any medication unless instructed by your physician -Make any legal decisions or sign important papers.  Meals: Start with liquid foods such as gelatin or soup. Progress to regular foods as tolerated. Avoid greasy, spicy, heavy foods. If nausea and/or vomiting occur, drink only clear liquids until the nausea and/or vomiting subsides. Call your physician if vomiting continues.  Special Instructions/Symptoms: Your throat may feel dry or sore from the anesthesia or the breathing tube placed in your throat during surgery. If this causes discomfort, gargle with warm salt water. The discomfort should disappear within 24 hours.  If you had a scopolamine patch placed behind your ear for the management of post- operative nausea and/or vomiting:  1. The medication in the patch is effective for 72 hours, after which it should be removed.  Wrap patch in a tissue and discard in the trash. Wash hands thoroughly with soap and water. 2. You may remove the patch earlier than 72 hours if you experience unpleasant side effects which may include dry mouth, dizziness or visual disturbances. 3. Avoid touching the patch. Wash your hands with soap and water after contact with the patch.    

## 2024-08-25 ENCOUNTER — Encounter (HOSPITAL_BASED_OUTPATIENT_CLINIC_OR_DEPARTMENT_OTHER): Payer: Self-pay | Admitting: General Surgery

## 2024-08-27 LAB — SURGICAL PATHOLOGY

## 2024-08-28 ENCOUNTER — Ambulatory Visit: Payer: Self-pay | Admitting: General Surgery

## 2024-08-30 ENCOUNTER — Encounter: Payer: Self-pay | Admitting: *Deleted

## 2024-09-03 ENCOUNTER — Inpatient Hospital Stay: Attending: Hematology and Oncology | Admitting: Hematology and Oncology

## 2024-09-03 VITALS — BP 138/74 | HR 77 | Temp 98.0°F | Resp 18 | Ht 62.0 in | Wt 110.3 lb

## 2024-09-03 DIAGNOSIS — Z885 Allergy status to narcotic agent status: Secondary | ICD-10-CM | POA: Insufficient documentation

## 2024-09-03 DIAGNOSIS — Z1721 Progesterone receptor positive status: Secondary | ICD-10-CM | POA: Insufficient documentation

## 2024-09-03 DIAGNOSIS — Z170 Estrogen receptor positive status [ER+]: Secondary | ICD-10-CM | POA: Insufficient documentation

## 2024-09-03 DIAGNOSIS — D0511 Intraductal carcinoma in situ of right breast: Secondary | ICD-10-CM | POA: Insufficient documentation

## 2024-09-03 DIAGNOSIS — Z882 Allergy status to sulfonamides status: Secondary | ICD-10-CM | POA: Diagnosis not present

## 2024-09-03 NOTE — Progress Notes (Signed)
 Patient Care Team: [name], MD as PCP - General (Family Medicine) Tyree Nanetta SAILOR, RN as Oncology Nurse Navigator Gerome, Devere HERO, RN as Oncology Nurse Navigator Curvin Deward MOULD, MD as Consulting Physician (General Surgery) Odean Potts, MD as Consulting Physician (Hematology and Oncology) Maritza Stagger, MD as Consulting Physician (Radiation Oncology)  DIAGNOSIS:  Encounter Diagnosis  Name Primary?   Ductal carcinoma in situ (DCIS) of right breast Yes    SUMMARY OF ONCOLOGIC HISTORY: Oncology History  Ductal carcinoma in situ (DCIS) of right breast  07/16/2024 Initial Diagnosis   Right breast architectural distortion, 1.4 cm calcifications, ultrasound: Irregular hypoechoic mass 8 mm, axilla negative, biopsy: High-grade DCIS solid type with apocrine features, ER 95%, PR 5%    07/24/2024 Cancer Staging   Staging form: Breast, AJCC 8th Edition - Clinical: Stage 0 (cTis (DCIS), cN0, cM0, G3, ER+, PR+, HER2: Not Assessed) - Signed by Odean Potts, MD on 07/24/2024 Stage prefix: Initial diagnosis Histologic grading system: 3 grade system     CHIEF COMPLIANT: Follow-up of recent surgery for DCIS  HISTORY OF PRESENT ILLNESS:   History of Present Illness Cynthia Benitez is a 89 year old female with ductal carcinoma in situ who presents for post-surgical follow-up.  She underwent surgery for ductal carcinoma in situ, with final pathology showing a 1.5 cm lesion and negative margins. The DCIS is estrogen and progesterone receptor-positive. She has not experienced significant pain or unexpected symptoms post-surgery. A follow-up appointment is scheduled for September 14, 2024, for scar evaluation and routine post-operative assessment.  She is here today accompanied by her son.     ALLERGIES:  is allergic to codeine and sulfa antibiotics.  MEDICATIONS:  Current Outpatient Medications  Medication Sig Dispense Refill   cholecalciferol (VITAMIN D3) 25 MCG (1000 UNIT) tablet 2  tablets Orally once daily     Cyanocobalamin  (VITAMIN B12) 1000 MCG TBCR 1 tablet Orally Once a day     Omega-3 Fatty Acids (FISH OIL) 1000 MG CAPS Take 2 capsules by mouth daily.     traMADol (ULTRAM) 50 MG tablet Take 1 tablet (50 mg total) by mouth every 6 (six) hours as needed. 10 tablet 0   No current facility-administered medications for this visit.    PHYSICAL EXAMINATION: ECOG PERFORMANCE STATUS: 1 - Symptomatic but completely ambulatory  Vitals:   09/03/24 1508  BP: 138/74  Pulse: 77  Resp: 18  Temp: 98 F (36.7 C)  SpO2: 94%   Filed Weights   09/03/24 1508  Weight: 110 lb 4.8 oz (50 kg)      LABORATORY DATA:  I have reviewed the data as listed    Latest Ref Rng & Units 08/17/2024    1:26 PM 08/03/2024   11:58 AM  CMP  Glucose 70 - 99 mg/dL 862  868   BUN 8 - 23 mg/dL 18  15   Creatinine 9.55 - 1.00 mg/dL 9.22  9.30   Sodium 864 - 145 mmol/L 136  134   Potassium 3.5 - 5.1 mmol/L 5.1  5.2   Chloride 98 - 111 mmol/L 96  99   CO2 22 - 32 mmol/L 30  29   Calcium 8.9 - 10.3 mg/dL 9.6  89.9   Total Protein 6.5 - 8.1 g/dL  7.0   Total Bilirubin 0.0 - 1.2 mg/dL  0.6   Alkaline Phos 38 - 126 U/L  64   AST 15 - 41 U/L  25   ALT 0 - 44 U/L  15     Lab Results  Component Value Date   WBC 8.6 08/03/2024   HGB 13.9 08/03/2024   HCT 40.7 08/03/2024   MCV 89.3 08/03/2024   PLT 127 (L) 08/03/2024   NEUTROABS 6.6 08/03/2024    ASSESSMENT & PLAN:  Ductal carcinoma in situ (DCIS) of right breast 07/16/2024: Right breast architectural distortion, 1.4 cm calcifications, ultrasound: Irregular hypoechoic mass 8 mm, axilla negative, biopsy: High-grade DCIS solid type with apocrine features, ER 95%, PR 5%  08/24/2024: Dr. Curvin: Right lumpectomy: High-grade DCIS 1.5 cm, margins negative, ER 95%, PR 5%  Pathology counseling: I discussed the final pathology report of the patient provided  a copy of this report. I discussed the margins.  We also discussed the final staging  along with previously performed ER/PR testing.  Treatment plan: +/- adjuvant radiation therapy When her age I did not recommend antiestrogen therapy.  Patient informed me that she has made a decision to not pursue radiation.  I have sent a message to Dr. Alvino who felt that was reasonable. Return to clinic on an as-needed basis.    No orders of the defined types were placed in this encounter.  The patient has a good understanding of the overall plan. she agrees with it. she will call with any problems that may develop before the next visit here.  I personally spent a total of 30 minutes in the care of the patient today including preparing to see the patient, getting/reviewing separately obtained history, performing a medically appropriate exam/evaluation, counseling and educating, placing orders, referring and communicating with other health care professionals, documenting clinical information in the EHR, independently interpreting results, communicating results, and coordinating care.   Viinay K Shannon Kirkendall, MD 09/03/24

## 2024-09-03 NOTE — Assessment & Plan Note (Signed)
 07/16/2024: Right breast architectural distortion, 1.4 cm calcifications, ultrasound: Irregular hypoechoic mass 8 mm, axilla negative, biopsy: High-grade DCIS solid type with apocrine features, ER 95%, PR 5%  08/24/2024: Dr. Curvin: Right lumpectomy: High-grade DCIS 1.5 cm, margins negative, ER 95%, PR 5%  Pathology counseling: I discussed the final pathology report of the patient provided  a copy of this report. I discussed the margins.  We also discussed the final staging along with previously performed ER/PR testing.  Treatment plan: +/- adjuvant radiation therapy When her age I did not recommend antiestrogen therapy.  Survivorship follow-up in 3 months

## 2024-09-04 ENCOUNTER — Encounter: Payer: Self-pay | Admitting: *Deleted

## 2024-09-04 DIAGNOSIS — D0511 Intraductal carcinoma in situ of right breast: Secondary | ICD-10-CM

## 2024-09-26 DIAGNOSIS — Z86018 Personal history of other benign neoplasm: Secondary | ICD-10-CM | POA: Diagnosis not present

## 2024-09-26 DIAGNOSIS — Z85828 Personal history of other malignant neoplasm of skin: Secondary | ICD-10-CM | POA: Diagnosis not present

## 2024-09-26 DIAGNOSIS — L821 Other seborrheic keratosis: Secondary | ICD-10-CM | POA: Diagnosis not present

## 2024-09-26 DIAGNOSIS — L814 Other melanin hyperpigmentation: Secondary | ICD-10-CM | POA: Diagnosis not present

## 2024-09-26 DIAGNOSIS — D225 Melanocytic nevi of trunk: Secondary | ICD-10-CM | POA: Diagnosis not present

## 2024-09-26 DIAGNOSIS — L578 Other skin changes due to chronic exposure to nonionizing radiation: Secondary | ICD-10-CM | POA: Diagnosis not present

## 2024-10-22 ENCOUNTER — Telehealth: Payer: Self-pay | Admitting: Adult Health

## 2024-10-22 NOTE — Telephone Encounter (Signed)
 i spoke w patient regarding 2/13 appt. Patient has been made aware of new appt date and time.

## 2024-12-07 ENCOUNTER — Inpatient Hospital Stay: Admitting: Adult Health

## 2024-12-14 ENCOUNTER — Inpatient Hospital Stay: Attending: Hematology and Oncology | Admitting: Adult Health
